# Patient Record
Sex: Female | Born: 1970 | Race: White | Hispanic: Yes | Marital: Single | Smoking: Current some day smoker
Health system: Southern US, Community
[De-identification: ages and names within clinical notes are randomized; demographics above are authoritative.]

## PROBLEM LIST (undated history)

## (undated) DIAGNOSIS — F329 Major depressive disorder, single episode, unspecified: Secondary | ICD-10-CM

## (undated) DIAGNOSIS — M1990 Unspecified osteoarthritis, unspecified site: Secondary | ICD-10-CM

## (undated) DIAGNOSIS — F32A Depression, unspecified: Secondary | ICD-10-CM

## (undated) DIAGNOSIS — K5792 Diverticulitis of intestine, part unspecified, without perforation or abscess without bleeding: Secondary | ICD-10-CM

## (undated) HISTORY — DX: Diverticulitis of intestine, part unspecified, without perforation or abscess without bleeding: K57.92

## (undated) HISTORY — DX: Unspecified osteoarthritis, unspecified site: M19.90

---

## 2010-01-07 DIAGNOSIS — K5792 Diverticulitis of intestine, part unspecified, without perforation or abscess without bleeding: Secondary | ICD-10-CM

## 2010-01-07 HISTORY — DX: Diverticulitis of intestine, part unspecified, without perforation or abscess without bleeding: K57.92

## 2010-01-07 LAB — HM COLONOSCOPY

## 2012-08-19 ENCOUNTER — Ambulatory Visit (INDEPENDENT_AMBULATORY_CARE_PROVIDER_SITE_OTHER): Payer: PRIVATE HEALTH INSURANCE | Admitting: Emergency Medicine

## 2012-08-19 ENCOUNTER — Ambulatory Visit: Payer: PRIVATE HEALTH INSURANCE

## 2012-08-19 VITALS — BP 122/68 | HR 64 | Temp 98.3°F | Resp 16 | Ht 64.0 in | Wt 244.0 lb

## 2012-08-19 DIAGNOSIS — M722 Plantar fascial fibromatosis: Secondary | ICD-10-CM

## 2012-08-19 DIAGNOSIS — IMO0002 Reserved for concepts with insufficient information to code with codable children: Secondary | ICD-10-CM

## 2012-08-19 DIAGNOSIS — S86912A Strain of unspecified muscle(s) and tendon(s) at lower leg level, left leg, initial encounter: Secondary | ICD-10-CM

## 2012-08-19 MED ORDER — NAPROXEN SODIUM 550 MG PO TABS: 550.0000 mg | ORAL_TABLET | Freq: Two times a day (BID) | ORAL | Status: DC

## 2012-08-19 NOTE — Progress Notes (Signed)
[facility] [address] [place] [address] [phone]  Date:  08/19/2012   Name:  Jean Bolton   DOB:  1970-02-06   MRN:  [id number]  PCP:  No PCP Per Patient    Chief Complaint: Knee Pain and Foot Pain   History of Present Illness:  Jean Bolton is a 42 y.o. very pleasant female patient who presents with the following:  Was doing squats in her morning workout and noticed that she had pain in her left knee after a short run.  Has pain with bending but no clicking or locking.  No history of documented injury. No history of prior knee injury as well.   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  Also has long history of am pain in right foot anterior to heel that persists.  No history of injury or overuse.  Thinks she has a spur.  No improvement with over the counter medications or other home remedies. Denies other complaint or health concern today.   There are no active problems to display for this patient.   History reviewed. No pertinent past medical history.  History reviewed. No pertinent past surgical history.  History  Substance Use Topics  . Smoking status: Never Smoker   . Smokeless tobacco: Not on file  . Alcohol Use: No    Family History  Problem Relation Age of Onset  . Diabetes Mother   . Hypertension Mother   . Hypertension Father     No Known Allergies  Medication list has been reviewed and updated.  No current outpatient prescriptions on file prior to visit.   No current facility-administered medications on file prior to visit.    Review of Systems:  As per HPI, otherwise negative.    Physical Examination: Filed Vitals:   08/19/12 2012  BP: 122/68  Pulse: 64  Temp: 98.3 F (36.8 C)  Resp: 16   Filed Vitals:   08/19/12 2012  Height: 5\' 4"  (1.626 m)  Weight: 244 lb (110.678 kg)   Body mass index is 41.86 kg/(m^2). Ideal Body Weight: Weight in (lb) to have BMI = 25:  145.3   GEN: WDWN, NAD, Non-toxic, Alert & Oriented x 3 HEENT: Atraumatic, Normocephalic.  Ears and Nose: No external deformity. EXTR: No clubbing/cyanosis/edema NEURO: Normal gait.  PSYCH: Normally interactive. Conversant. Not depressed or anxious appearing.  Calm demeanor.  LEFT KNEE:  Tender.  Scant effusion.  Full ROM passive. RIGHT foot:  Tender anterior to heel no ecchymosis swelling or deformity.  Assessment and Plan: Plantar fasciitis Knee strain Anaprox   Signed,  Phillips Odor, MD   [facility] reading (PRIMARY) by  Dr. Dareen Piano  Knee negative.  [facility] reading (PRIMARY) by  Dr. Dareen Piano.  Foot spur.

## 2012-08-19 NOTE — Patient Instructions (Addendum)

## 2012-10-29 ENCOUNTER — Emergency Department (HOSPITAL_BASED_OUTPATIENT_CLINIC_OR_DEPARTMENT_OTHER)
Admission: EM | Admit: 2012-10-29 | Discharge: 2012-10-29 | Disposition: A | Discharge status: Home / Self Care | Payer: PRIVATE HEALTH INSURANCE | Attending: Emergency Medicine | Admitting: Emergency Medicine

## 2012-10-29 ENCOUNTER — Encounter (HOSPITAL_BASED_OUTPATIENT_CLINIC_OR_DEPARTMENT_OTHER): Payer: Self-pay | Admitting: Emergency Medicine

## 2012-10-29 ENCOUNTER — Emergency Department (HOSPITAL_BASED_OUTPATIENT_CLINIC_OR_DEPARTMENT_OTHER): Payer: PRIVATE HEALTH INSURANCE

## 2012-10-29 DIAGNOSIS — S8392XA Sprain of unspecified site of left knee, initial encounter: Secondary | ICD-10-CM

## 2012-10-29 DIAGNOSIS — F17200 Nicotine dependence, unspecified, uncomplicated: Secondary | ICD-10-CM | POA: Insufficient documentation

## 2012-10-29 DIAGNOSIS — X500XXA Overexertion from strenuous movement or load, initial encounter: Secondary | ICD-10-CM | POA: Insufficient documentation

## 2012-10-29 DIAGNOSIS — F329 Major depressive disorder, single episode, unspecified: Secondary | ICD-10-CM | POA: Insufficient documentation

## 2012-10-29 DIAGNOSIS — Z79899 Other long term (current) drug therapy: Secondary | ICD-10-CM | POA: Insufficient documentation

## 2012-10-29 DIAGNOSIS — IMO0002 Reserved for concepts with insufficient information to code with codable children: Secondary | ICD-10-CM | POA: Insufficient documentation

## 2012-10-29 DIAGNOSIS — Y929 Unspecified place or not applicable: Secondary | ICD-10-CM | POA: Insufficient documentation

## 2012-10-29 DIAGNOSIS — Y9302 Activity, running: Secondary | ICD-10-CM | POA: Insufficient documentation

## 2012-10-29 DIAGNOSIS — F3289 Other specified depressive episodes: Secondary | ICD-10-CM | POA: Insufficient documentation

## 2012-10-29 HISTORY — DX: Depression, unspecified: F32.A

## 2012-10-29 HISTORY — DX: Major depressive disorder, single episode, unspecified: F32.9

## 2012-10-29 MED ORDER — OXYCODONE-ACETAMINOPHEN 5-325 MG PO TABS: 1.0000 | ORAL_TABLET | ORAL | Status: DC | PRN

## 2012-10-29 MED ORDER — CYCLOBENZAPRINE HCL 10 MG PO TABS: 10.0000 mg | ORAL_TABLET | Freq: Two times a day (BID) | ORAL | Status: DC | PRN

## 2012-10-29 NOTE — ED Notes (Signed)
Pt hyperflexed left knee approx 3 weeks ago.  Continues to have ROM issues and pain.

## 2012-10-29 NOTE — ED Provider Notes (Signed)
CSN: [id number]     Arrival date & time 10/29/12  1015 History   First MD Initiated Contact with Patient 10/29/12 1052     Chief Complaint  Patient presents with  . Knee Injury   (Consider location/radiation/quality/duration/timing/severity/associated sxs/prior Treatment) HPI Comments: 42 year old female presents with 3 weeks of left knee pain. She states that when she is running she felt and heard a pop. She's does not remember a specific injury but thinks she may have hyperextended it. She's not noticed any swelling. She denies any redness. She's been able to walk on it but when she stands for prolonged times at work it hurts worse. She injured his knee a couple months ago and saw her PCP who did an x-ray and gave her some naproxen. The pain is not quite similar to that. She denies any weakness or numbness. She was told she had an MRI at this facility and so she came to get checked out. She states she's not feeling her insurance did not for her to get an orthopedist. Besides compresses she's not tried anything for the pain   Past Medical History  Diagnosis Date  . Depression    No past surgical history on file. Family History  Problem Relation Age of Onset  . Diabetes Mother   . Hypertension Mother   . Hypertension Father    History  Substance Use Topics  . Smoking status: Current Some Day Smoker  . Smokeless tobacco: Not on file  . Alcohol Use: No   OB History   Grav Para Term Preterm Abortions TAB SAB Ect Mult Living                 Review of Systems  Constitutional: Negative for fever and chills.  Musculoskeletal: Negative for gait problem and joint swelling.       Left knee pain  Skin: Negative for color change and wound.  All other systems reviewed and are negative.    Allergies  Review of patient's allergies indicates no known allergies.  Home Medications   Current Outpatient Rx  Name  Route  Sig  Dispense  Refill  . buPROPion (WELLBUTRIN XL) 300 MG 24 hr  tablet   Oral   Take 300 mg by mouth daily.         Marland Kitchen oxyCODONE-acetaminophen (PERCOCET) 5-325 MG per tablet   Oral   Take 1 tablet by mouth every 4 (four) hours as needed for pain.   10 tablet   0    BP 147/74  Pulse 67  Temp(Src) 98.2 F (36.8 C)  Resp 20  Ht 5\' 4"  (1.626 m)  Wt 239 lb (108.41 kg)  BMI 41 kg/m2  SpO2 100%  LMP 10/12/2012 Physical Exam  Nursing note and vitals reviewed. Constitutional: She is oriented to person, place, and time. She appears well-developed and well-nourished.  HENT:  Head: Normocephalic and atraumatic.  Right Ear: External ear normal.  Left Ear: External ear normal.  Nose: Nose normal.  Eyes: Right eye exhibits no discharge. Left eye exhibits no discharge.  Cardiovascular: Intact distal pulses.   Pulses:      Dorsalis pedis pulses are 2+ on the right side, and 2+ on the left side.  Pulmonary/Chest: Effort normal.  Abdominal: Soft. She exhibits no distension.  Musculoskeletal:       Left knee: She exhibits no swelling, no deformity and no erythema. Tenderness (Inferior to the knee medially) found.  No ligamentous laxity upon testing. Normal neurovascular status of left leg  Neurological: She is alert and oriented to person, place, and time.  Skin: Skin is warm and dry. No erythema.    ED Course  Procedures (including critical care time) Labs Review Labs Reviewed - No data to display Imaging Review Dg Knee Complete 4 Views Left  10/29/2012   CLINICAL DATA:  Pain post trauma  EXAM: LEFT KNEE - COMPLETE 4+ VIEW  COMPARISON:  August 19, 2012  FINDINGS: Frontal, lateral, and bilateral oblique views were obtained. No fracture, dislocation, or effusion. Joint spaces appear intact. No erosive change.  Small calcifications anterior to the proximal tibia probably represent small phleboliths.  IMPRESSION: No fracture or appreciable arthropathy. No demonstrable effusion.   Electronically Signed   By: Bretta Bang M.D.   On: 10/29/2012 10:46     EKG Interpretation   None       MDM   1. Left knee sprain, initial encounter    Xray is benign. Is able to ambulate w/o difficulty in the ED. No MRI available at this facility, and would not be emergently indicated anyway. No joint swelling to suggest an effusion. Will treat symptomatically and recommend RICE care and f/u with orthopedist.    Audree Camel, MD 10/29/12 [phone]

## 2012-10-30 ENCOUNTER — Encounter: Payer: Self-pay | Admitting: Family Medicine

## 2012-10-30 ENCOUNTER — Ambulatory Visit (INDEPENDENT_AMBULATORY_CARE_PROVIDER_SITE_OTHER): Payer: PRIVATE HEALTH INSURANCE | Admitting: Family Medicine

## 2012-10-30 VITALS — BP 138/79 | HR 74 | Ht 64.0 in | Wt 239.0 lb

## 2012-10-30 DIAGNOSIS — M25562 Pain in left knee: Secondary | ICD-10-CM

## 2012-10-30 DIAGNOSIS — M25569 Pain in unspecified knee: Secondary | ICD-10-CM

## 2012-10-30 NOTE — Patient Instructions (Signed)
Your history and exam are consistent with a distal hamstring stain/partial tear, less likely a medial meniscus tear. Both are treated conservatively initially. Aleve 2 tabs twice a day with food for pain and inflammation. Icing 15 minutes at a time 3-4 times a day. Start physical therapy and do home exercises on days you don't go to therapy. ACE wrap or compression sleeve when up and walking around. Follow up with me in 1 month. If not improving would consider an MRI.

## 2012-11-03 ENCOUNTER — Encounter: Payer: Self-pay | Admitting: Family Medicine

## 2012-11-03 DIAGNOSIS — M25562 Pain in left knee: Secondary | ICD-10-CM | POA: Insufficient documentation

## 2012-11-03 NOTE — Progress Notes (Signed)
Patient ID: Jean Bolton, female   DOB: 02-Jul-1970, 42 y.o.   MRN: [id number]  PCP: No PCP Per Patient  Subjective:   HPI: Patient is a 42 y.o. female here for left knee injury.  Patient reports on 10/3 she was running on the street when she felt a pop posterior left knee with associated swelling. No bruising. No prior knee issues. Had difficulty walking, standing after this. Knee feels unstable. Still having trouble walking comfortably. No locking, catching.  Past Medical History  Diagnosis Date  . Depression     Current Outpatient Prescriptions on File Prior to Visit  Medication Sig Dispense Refill  . buPROPion (WELLBUTRIN XL) 300 MG 24 hr tablet Take 300 mg by mouth daily.      Marland Kitchen oxyCODONE-acetaminophen (PERCOCET) 5-325 MG per tablet Take 1 tablet by mouth every 4 (four) hours as needed for pain.  10 tablet  0   No current facility-administered medications on file prior to visit.    History reviewed. No pertinent past surgical history.  No Known Allergies  History   Social History  . Marital Status: Single    Spouse Name: N/A    Number of Children: N/A  . Years of Education: N/A   Occupational History  . Not on file.   Social History Main Topics  . Smoking status: Current Some Day Smoker  . Smokeless tobacco: Not on file  . Alcohol Use: No  . Drug Use: No  . Sexual Activity: Not on file   Other Topics Concern  . Not on file   Social History Narrative  . No narrative on file    Family History  Problem Relation Age of Onset  . Diabetes Mother   . Hypertension Mother   . Hyperlipidemia Mother   . Hypertension Father   . Hyperlipidemia Father   . Heart attack Neg Hx   . Sudden death Neg Hx     BP 138/79  Pulse 74  Ht 5\' 4"  (1.626 m)  Wt 239 lb (108.41 kg)  BMI 41 kg/m2  LMP 10/12/2012  Review of Systems: See HPI above.    Objective:  Physical Exam:  Gen: NAD  Left knee: No gross deformity, ecchymoses, effusion. TTP medial  hamstring reproducing pain though tendon feels intact.  Tender through to pes area. FROM.  Pain with resisted knee flexion at 90 degrees. Negative ant/post drawers. Negative valgus/varus testing. Negative lachmanns. Negative mcmurrays, apleys, patellar apprehension. NV intact distally.    Assessment & Plan:  1. Left knee injury - Her history of posterior knee pain/pop with pain wrapping around to medial knee, lack of effusion on exam, otherwise benign exam all consistent with distal hamstring strain or partial tear.  Start with icing, nsaids, compression, physical therapy and home exercises.  F/u in 1 month.  Consider MRI if not improving.

## 2012-11-03 NOTE — Assessment & Plan Note (Signed)
Her history of posterior knee pain/pop with pain wrapping around to medial knee, lack of effusion on exam, otherwise benign exam all consistent with distal hamstring strain or partial tear.  Start with icing, nsaids, compression, physical therapy and home exercises.  F/u in 1 month.  Consider MRI if not improving.

## 2012-11-30 ENCOUNTER — Ambulatory Visit (INDEPENDENT_AMBULATORY_CARE_PROVIDER_SITE_OTHER): Payer: PRIVATE HEALTH INSURANCE | Admitting: Family Medicine

## 2012-11-30 ENCOUNTER — Encounter: Payer: Self-pay | Admitting: Family Medicine

## 2012-11-30 VITALS — BP 151/86 | HR 91 | Ht 64.0 in | Wt 240.0 lb

## 2012-11-30 DIAGNOSIS — M25562 Pain in left knee: Secondary | ICD-10-CM

## 2012-11-30 DIAGNOSIS — M19049 Primary osteoarthritis, unspecified hand: Secondary | ICD-10-CM

## 2012-11-30 DIAGNOSIS — M25569 Pain in unspecified knee: Secondary | ICD-10-CM

## 2012-12-01 ENCOUNTER — Encounter (INDEPENDENT_AMBULATORY_CARE_PROVIDER_SITE_OTHER): Payer: Self-pay

## 2012-12-01 ENCOUNTER — Encounter: Payer: Self-pay | Admitting: Family Medicine

## 2012-12-01 ENCOUNTER — Ambulatory Visit (INDEPENDENT_AMBULATORY_CARE_PROVIDER_SITE_OTHER): Payer: PRIVATE HEALTH INSURANCE | Admitting: Family Medicine

## 2012-12-01 VITALS — BP 130/79 | HR 84 | Ht 64.0 in | Wt 240.0 lb

## 2012-12-01 DIAGNOSIS — M19049 Primary osteoarthritis, unspecified hand: Secondary | ICD-10-CM

## 2012-12-01 NOTE — Progress Notes (Signed)
Patient ID: Jean Bolton, female   DOB: May 15, 1970, 42 y.o.   MRN: [id number]  PCP: No PCP Per Patient  Subjective:   HPI: Patient is a 42 y.o. female here for left knee injury.  10/24: Patient reports on 10/3 she was running on the street when she felt a pop posterior left knee with associated swelling. No bruising. No prior knee issues. Had difficulty walking, standing after this. Knee feels unstable. Still having trouble walking comfortably. No locking, catching.  11/24: Patient states her knee feels much better. Only occasional pain in certain positions. Able to exercise though. Did not do formal PT but compliant with HEP. Not taking anything for pain now. No catching, locking, giving out.  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 Would like injection.  Past Medical History  Diagnosis Date  . Depression     Current Outpatient Prescriptions on File Prior to Visit  Medication Sig Dispense Refill  . buPROPion (WELLBUTRIN XL) 300 MG 24 hr tablet Take 300 mg by mouth daily.      Marland Kitchen oxyCODONE-acetaminophen (PERCOCET) 5-325 MG per tablet Take 1 tablet by mouth every 4 (four) hours as needed for pain.  10 tablet  0   No current facility-administered medications on file prior to visit.    History reviewed. No pertinent past surgical history.  No Known Allergies  History   Social History  . Marital Status: Single    Spouse Name: N/A    Number of Children: N/A  . Years of Education: N/A   Occupational History  . Not on file.   Social History Main Topics  . Smoking status: Current Some Day Smoker  . Smokeless tobacco: Not on file  . Alcohol Use: No  . Drug Use: No  . Sexual Activity: Not on file   Other Topics Concern  . Not on file   Social History Narrative  . No narrative on file    Family History  Problem Relation Age of Onset  . Diabetes Mother   . Hypertension Mother   . Hyperlipidemia  Mother   . Hypertension Father   . Hyperlipidemia Father   . Heart attack Neg Hx   . Sudden death Neg Hx     BP 151/86  Pulse 91  Ht 5\' 4"  (1.626 m)  Wt 240 lb (108.863 kg)  BMI 41.18 kg/m2  Review of Systems: See HPI above.    Objective:  Physical Exam:  Gen: NAD  Left knee: No gross deformity, ecchymoses, effusion. No longer with TTP medial hamstring.  No joint line, other TTP. FROM.  No pain with resisted knee flexion at 90 degrees. Negative ant/post drawers. Negative valgus/varus testing. Negative lachmanns. Negative mcmurrays, apleys, patellar apprehension. NV intact distally.    Assessment & Plan:  1. Left knee injury - Consistent with distal hamstring strain but has improved significantly since last visit.  Encouraged to continue with HEP for next 4-6 weeks.  Icing, nsaids, compression as needed.    2. 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  No evidence carpal tunnel, dequervains, other pathology.  Will return tomorrow to go ahead with injection.  Discussed thumb spica brace also.

## 2012-12-01 NOTE — Assessment & Plan Note (Signed)
Consistent with distal hamstring strain but has improved significantly since last visit.  Encouraged to continue with HEP for next 4-6 weeks.  Icing, nsaids, compression as needed.

## 2012-12-01 NOTE — Assessment & Plan Note (Signed)
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  No evidence carpal tunnel, dequervains, other pathology.  Will return tomorrow to go ahead with injection.  Discussed thumb spica brace also.

## 2012-12-05 ENCOUNTER — Encounter: Payer: Self-pay | Admitting: Family Medicine

## 2012-12-05 NOTE — Assessment & Plan Note (Signed)
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  No evidence carpal tunnel, dequervains, other pathology.  Injection given today with ultrasound to identify joint space.  After informed written consent patient was seated in exam room.  1st CMC joint left hand identified with ultrasound.  Prepped with alcohol swab then injected with 0.5:0.23mL marcaine:depomedrol.  Patient tolerated procedure well without immediate complications.

## 2012-12-05 NOTE — Progress Notes (Signed)
Patient ID: Jean Bolton, female   DOB: October 06, 1970, 42 y.o.   MRN: [id number]  PCP: No PCP Per Patient  Subjective:   HPI: Patient is a 42 y.o. female here for left knee injury.  10/24: Patient reports on 10/3 she was running on the street when she felt a pop posterior left knee with associated swelling. No bruising. No prior knee issues. Had difficulty walking, standing after this. Knee feels unstable. Still having trouble walking comfortably. No locking, catching.  11/24: Patient states her knee feels much better. Only occasional pain in certain positions. Able to exercise though. Did not do formal PT but compliant with HEP. Not taking anything for pain now. No catching, locking, giving out.  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 Would like injection.  11/25: Patient returns for 1st [facility] injection left hand.  Past Medical History  Diagnosis Date  . Depression     Current Outpatient Prescriptions on File Prior to Visit  Medication Sig Dispense Refill  . buPROPion (WELLBUTRIN XL) 300 MG 24 hr tablet Take 300 mg by mouth daily.      Marland Kitchen oxyCODONE-acetaminophen (PERCOCET) 5-325 MG per tablet Take 1 tablet by mouth every 4 (four) hours as needed for pain.  10 tablet  0   No current facility-administered medications on file prior to visit.    History reviewed. No pertinent past surgical history.  No Known Allergies  History   Social History  . Marital Status: Single    Spouse Name: N/A    Number of Children: N/A  . Years of Education: N/A   Occupational History  . Not on file.   Social History Main Topics  . Smoking status: Current Some Day Smoker  . Smokeless tobacco: Not on file  . Alcohol Use: No  . Drug Use: No  . Sexual Activity: Not on file   Other Topics Concern  . Not on file   Social History Narrative  . No narrative on file    Family History  Problem Relation Age of Onset  .  Diabetes Mother   . Hypertension Mother   . Hyperlipidemia Mother   . Hypertension Father   . Hyperlipidemia Father   . Heart attack Neg Hx   . Sudden death Neg Hx     BP 130/79  Pulse 84  Ht 5\' 4"  (1.626 m)  Wt 240 lb (108.863 kg)  BMI 41.18 kg/m2  Review of Systems: See HPI above.    Objective:  Physical Exam:  Gen: NAD  Left knee: No gross deformity, ecchymoses, effusion. No longer with TTP medial hamstring.  No joint line, other TTP. FROM.  No pain with resisted knee flexion at 90 degrees. Negative ant/post drawers. Negative valgus/varus testing. Negative lachmanns. Negative mcmurrays, apleys, patellar apprehension. NV intact distally.  Left hand: No gross deformity, swelling bruising. TTP 1st CMC.  No 1st dorsal compartment, carpal tunnel tenderness. Negative finkelsteins, tinels.    Assessment & Plan:  1. 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  No evidence carpal tunnel, dequervains, other pathology.  Injection given today with ultrasound to identify joint space.  After informed written consent patient was seated in exam room.  1st CMC joint left hand identified with ultrasound.  Prepped with alcohol swab then injected with 0.5:0.69mL marcaine:depomedrol.  Patient tolerated procedure well without immediate complications.

## 2012-12-09 ENCOUNTER — Encounter: Payer: Self-pay | Admitting: Family

## 2012-12-09 ENCOUNTER — Ambulatory Visit (INDEPENDENT_AMBULATORY_CARE_PROVIDER_SITE_OTHER): Payer: PRIVATE HEALTH INSURANCE | Admitting: Family

## 2012-12-09 VITALS — BP 122/88 | HR 78 | Temp 98.1°F | Resp 16 | Ht 64.5 in | Wt 240.0 lb

## 2012-12-09 DIAGNOSIS — F329 Major depressive disorder, single episode, unspecified: Secondary | ICD-10-CM

## 2012-12-09 DIAGNOSIS — R002 Palpitations: Secondary | ICD-10-CM | POA: Insufficient documentation

## 2012-12-09 DIAGNOSIS — K589 Irritable bowel syndrome without diarrhea: Secondary | ICD-10-CM | POA: Insufficient documentation

## 2012-12-09 DIAGNOSIS — K8020 Calculus of gallbladder without cholecystitis without obstruction: Secondary | ICD-10-CM

## 2012-12-09 DIAGNOSIS — Z720 Tobacco use: Secondary | ICD-10-CM

## 2012-12-09 DIAGNOSIS — F17200 Nicotine dependence, unspecified, uncomplicated: Secondary | ICD-10-CM

## 2012-12-09 LAB — CBC WITH DIFFERENTIAL/PLATELET
Basophils Absolute: 0 10*3/uL (ref 0.0–0.1)
Basophils Relative: 0 % (ref 0–1)
Eosinophils Absolute: 0.1 10*3/uL (ref 0.0–0.7)
Eosinophils Relative: 1 % (ref 0–5)
HCT: 38.8 % (ref 36.0–46.0)
MCH: 30 pg (ref 26.0–34.0)
MCHC: 34 g/dL (ref 30.0–36.0)
MCV: 88.2 fL (ref 78.0–100.0)
Monocytes Absolute: 0.4 10*3/uL (ref 0.1–1.0)
Monocytes Relative: 6 % (ref 3–12)
Neutro Abs: 4.7 10*3/uL (ref 1.7–7.7)
Platelets: 330 10*3/uL (ref 150–400)
RDW: 13.8 % (ref 11.5–15.5)
WBC: 7 10*3/uL (ref 4.0–10.5)

## 2012-12-09 LAB — BASIC METABOLIC PANEL WITH GFR
BUN: 13 mg/dL (ref 6–23)
Calcium: 9.2 mg/dL (ref 8.4–10.5)
Creat: 0.65 mg/dL (ref 0.50–1.10)
GFR, Est African American: >89 mL/min
GFR, Est Non African American: >89 mL/min
Glucose, Bld: 71 mg/dL (ref 70–99)
Sodium: 138 mEq/L (ref 135–145)

## 2012-12-09 NOTE — Assessment & Plan Note (Signed)
We discussed complete cessation.

## 2012-12-09 NOTE — Assessment & Plan Note (Addendum)
New, mild. Check TSH, BMET, CBC.  EKG shows NSR.

## 2012-12-09 NOTE — Assessment & Plan Note (Signed)
Currently asymptomatic. I have advised pt to call if symptoms recur.

## 2012-12-09 NOTE — Progress Notes (Signed)
Subjective:    Patient ID: Jean Bolton, female    DOB: 03/10/1970, 42 y.o.   MRN: [id number]  HPI  Jean Bolton is a 42 yr old female who presents today to establish care.  Reports that she has suffered off/on with depression.  Reports she was first diagnosed in 2005.  She reports that she sleeps poorly.  + snoring, wakes up at 5 and works out.  Reports that she wakes up almost every hour.  Reports + concerns re: anxiety.  She denies panic attacks but reports wheels keep turning. Denies hopelessness.  She takes abilify and wellbutrin. She is followed at [facility].  Tobacco abuse- smokes 1 cigarette a week, down to 1-2 times a day on vapor.    CMC arthritis- had shot in left last week with Dr. Pearletha Forge last week. Office note is reviewed. Improved.  L knee pain- bothers her with running.  Had injury in October.  Reports + hx of gallstones- was recommended that she have cholecystectomy in 2012.  Did not due to insurance.  Last symptoms 2/14.    Palpitations- notes that she has intermittent brief palpitations once daily. Drinks 2 cups coffee a day.   Review of Systems  Constitutional:       Has lost 40 pounds in the last 6 months with exercising  HENT: Negative for rhinorrhea.   Eyes: Negative for visual disturbance.  Respiratory: Negative for cough.   Cardiovascular: Negative for chest pain.       Occasional palpitations- happens once a day.  Gastrointestinal:       Chronic constipation- IBS  Genitourinary: Negative for dysuria and menstrual problem.  Musculoskeletal: Positive for arthralgias. Negative for myalgias.  Skin: Negative for rash.  Neurological: Negative for headaches.  Hematological: Negative for adenopathy.  Psychiatric/Behavioral:       See HPI   Past Medical History  Diagnosis Date  . Depression   . Arthritis   . Diverticulitis 2012    History   Social History  . Marital Status: Single    Spouse Name: N/A    Number of Children: N/A  . Years of  Education: N/A   Occupational History  . Not on file.   Social History Main Topics  . Smoking status: Current Some Day Smoker  . Smokeless tobacco: Never Used     Comment: Pt is currently vaping.  . Alcohol Use: 1.5 - 3 oz/week    3-6 drink(s) per week  . Drug Use: No  . Sexual Activity: Not on file   Other Topics Concern  . Not on file   Social History Narrative   Daughter in [facility]   Son in [place]   Single   Has 1 Academic librarian at [employer]   Completed associates degree          History reviewed. No pertinent past surgical history.  Family History  Problem Relation Age of Onset  . Diabetes Mother   . Hypertension Mother   . Arthritis Mother   . Hypertension Father   . Arthritis Father   . Alcohol abuse Father   . Heart attack Neg Hx   . Sudden death Neg Hx   . Hypertension Maternal Grandmother   . Hypertension Maternal Grandfather   . Arthritis Maternal Grandfather     No Known Allergies  Current Outpatient Prescriptions on File Prior to Visit  Medication Sig Dispense Refill  . buPROPion (WELLBUTRIN XL) 300 MG 24 hr tablet Take 300 mg by mouth  daily.       No current facility-administered medications on file prior to visit.    BP 122/88  Pulse 78  Temp(Src) 98.1 F (36.7 C) (Oral)  Resp 16  Ht 5' 4.5" (1.638 m)  Wt 240 lb 0.6 oz (108.881 kg)  BMI 40.58 kg/m2  SpO2 99%  LMP 12/03/2012       Objective:   Physical Exam  Constitutional: She is oriented to person, place, and time. She appears well-developed and well-nourished. No distress.  HENT:  Head: Normocephalic and atraumatic.  Cardiovascular: Normal rate and regular rhythm.   No murmur heard. Pulmonary/Chest: Effort normal and breath sounds normal. No respiratory distress. She has no wheezes. She has no rales. She exhibits no tenderness.  Abdominal: Soft. Bowel sounds are normal.  Musculoskeletal: She exhibits no edema.  Lymphadenopathy:    She has no cervical adenopathy.  Neurological:  She is alert and oriented to person, place, and time.  Psychiatric: She has a normal mood and affect. Her behavior is normal. Judgment and thought content normal.          Assessment & Plan:

## 2012-12-09 NOTE — Patient Instructions (Signed)
Please complete lab work prior to leaving. Schedule fasting physical after the holidays. Welcome to [facility]!

## 2012-12-09 NOTE — Assessment & Plan Note (Addendum)
Stable. Some job stress. We discussed trying to meet with therapist at [facility]. She is agreeable.

## 2012-12-10 ENCOUNTER — Encounter: Payer: Self-pay | Admitting: Family

## 2012-12-11 ENCOUNTER — Encounter: Payer: Self-pay | Admitting: *Deleted

## 2013-01-13 ENCOUNTER — Ambulatory Visit (INDEPENDENT_AMBULATORY_CARE_PROVIDER_SITE_OTHER): Payer: PRIVATE HEALTH INSURANCE | Admitting: Family

## 2013-01-13 ENCOUNTER — Other Ambulatory Visit (HOSPITAL_COMMUNITY)
Admission: RE | Admit: 2013-01-13 | Discharge: 2013-01-13 | Disposition: A | Discharge status: Home / Self Care | Payer: PRIVATE HEALTH INSURANCE | Source: Ambulatory Visit | Attending: Family | Admitting: Family

## 2013-01-13 ENCOUNTER — Encounter: Payer: Self-pay | Admitting: Family

## 2013-01-13 VITALS — BP 130/86 | HR 64 | Temp 98.1°F | Resp 16 | Ht 64.5 in | Wt 247.0 lb

## 2013-01-13 DIAGNOSIS — Z0000 Encounter for general adult medical examination without abnormal findings: Secondary | ICD-10-CM | POA: Insufficient documentation

## 2013-01-13 DIAGNOSIS — Z1151 Encounter for screening for human papillomavirus (HPV): Secondary | ICD-10-CM | POA: Insufficient documentation

## 2013-01-13 DIAGNOSIS — Z01419 Encounter for gynecological examination (general) (routine) without abnormal findings: Secondary | ICD-10-CM | POA: Insufficient documentation

## 2013-01-13 LAB — HEPATIC FUNCTION PANEL
ALBUMIN: 3.9 g/dL (ref 3.5–5.2)
ALK PHOS: 58 U/L (ref 39–117)
ALT: 23 U/L (ref 0–35)
AST: 13 U/L (ref 0–37)
Bilirubin, Direct: 0.1 mg/dL (ref 0.0–0.3)
Indirect Bilirubin: 0.3 mg/dL (ref 0.0–0.9)
Total Bilirubin: 0.4 mg/dL (ref 0.3–1.2)
Total Protein: 6.2 g/dL (ref 6.0–8.3)

## 2013-01-13 LAB — CBC WITH DIFFERENTIAL/PLATELET
Basophils Absolute: 0 10*3/uL (ref 0.0–0.1)
Basophils Relative: 0 % (ref 0–1)
EOS ABS: 0.1 10*3/uL (ref 0.0–0.7)
Eosinophils Relative: 1 % (ref 0–5)
HCT: 36.5 % (ref 36.0–46.0)
HEMOGLOBIN: 12.1 g/dL (ref 12.0–15.0)
LYMPHS ABS: 1.7 10*3/uL (ref 0.7–4.0)
Lymphocytes Relative: 21 % (ref 12–46)
MCH: 30.3 pg (ref 26.0–34.0)
MCHC: 33.2 g/dL (ref 30.0–36.0)
MCV: 91.5 fL (ref 78.0–100.0)
MONOS PCT: 6 % (ref 3–12)
Monocytes Absolute: 0.5 10*3/uL (ref 0.1–1.0)
NEUTROS PCT: 72 % (ref 43–77)
Neutro Abs: 5.8 10*3/uL (ref 1.7–7.7)
Platelets: 280 10*3/uL (ref 150–400)
RBC: 3.99 MIL/uL (ref 3.87–5.11)
RDW: 13.5 % (ref 11.5–15.5)
WBC: 8 10*3/uL (ref 4.0–10.5)

## 2013-01-13 LAB — BASIC METABOLIC PANEL WITH GFR
BUN: 11 mg/dL (ref 6–23)
CHLORIDE: 104 meq/L (ref 96–112)
CO2: 25 mEq/L (ref 19–32)
Calcium: 8.6 mg/dL (ref 8.4–10.5)
Creat: 0.61 mg/dL (ref 0.50–1.10)
GFR, Est Non African American: >89 mL/min
Glucose, Bld: 77 mg/dL (ref 70–99)
POTASSIUM: 4.4 meq/L (ref 3.5–5.3)
SODIUM: 136 meq/L (ref 135–145)

## 2013-01-13 LAB — LIPID PANEL
CHOL/HDL RATIO: 2.8 ratio
Cholesterol: 164 mg/dL (ref 0–200)
HDL: 58 mg/dL (ref >39)
LDL CALC: 97 mg/dL (ref 0–99)
Triglycerides: 47 mg/dL (ref <150)
VLDL: 9 mg/dL (ref 0–40)

## 2013-01-13 LAB — TSH: TSH: 2.174 u[IU]/mL (ref 0.350–4.500)

## 2013-01-13 NOTE — Progress Notes (Signed)
Subjective:    Patient ID: Jean Bolton, female    DOB: Aug 29, 1970, 43 y.o.   MRN: [id number]  HPI  [name] is a 43 yr old female who presents today for cpx.  Immunizations: declines flu shot, tetanus up to date Diet: reports healthy diet Exercise: works out 4 days a week. Colonoscopy: up to date Pap Smear: due Mammogram: due  Depression- follows with psychiatry, stopped abilify as she did not feel that it was helping.  Still feels irritable.  Will arrange follow up at [facility]  Review of Systems  Constitutional:       Wt Readings from Last 3 Encounters: 01/13/13 : 247 lb 0.6 oz (112.057 kg) 12/09/12 : 240 lb 0.6 oz (108.881 kg) 12/01/12 : 240 lb (108.863 kg)   HENT: Negative for hearing loss.        Mild nasal congestion from resolving cold  Eyes: Negative for visual disturbance.  Respiratory: Negative for cough and shortness of breath.   Cardiovascular: Negative for chest pain.  Gastrointestinal: Positive for constipation.  Genitourinary: Negative for dysuria and frequency.       Reports normal menses, LMP 12/23  Musculoskeletal: Negative for arthralgias and myalgias.  Skin:       Dry lesion on back  Neurological: Negative for headaches.  Hematological:       Bruises easily but attributes to working out and "hitting myself"   Past Medical History  Diagnosis Date  . Depression   . Arthritis   . Diverticulitis 2012    History   Social History  . Marital Status: Single    Spouse Name: N/A    Number of Children: N/A  . Years of Education: N/A   Occupational History  . Not on file.   Social History Main Topics  . Smoking status: Current Some Day Smoker  . Smokeless tobacco: Never Used     Comment: Pt is currently vaping.  . Alcohol Use: 1.5 - 3 oz/week    3-6 drink(s) per week  . Drug Use: No  . Sexual Activity: Not on file   Other Topics Concern  . Not on file   Social History Narrative   Daughter in [facility]   Son in [place]   Single   Has 1 Biochemist, clinical at [employer]   Completed associates degree          History reviewed. No pertinent past surgical history.  Family History  Problem Relation Age of Onset  . Diabetes Mother   . Hypertension Mother   . Arthritis Mother   . Hypertension Father   . Arthritis Father   . Alcohol abuse Father   . Heart attack Neg Hx   . Sudden death Neg Hx   . Hypertension Maternal Grandmother   . Hypertension Maternal Grandfather   . Arthritis Maternal Grandfather     No Known Allergies  No current outpatient prescriptions on file prior to visit.   No current facility-administered medications on file prior to visit.    BP 130/86  Pulse 64  Temp(Src) 98.1 F (36.7 C) (Oral)  Resp 16  Ht 5' 4.5" (1.638 m)  Wt 247 lb 0.6 oz (112.057 kg)  BMI 41.76 kg/m2  SpO2 99%  LMP 12/29/2012       Objective:   Physical Exam  Physical Exam  Constitutional: She is oriented to person, place, and time. She appears well-developed and well-nourished. No distress.  HENT:  Head: Normocephalic and atraumatic.  Right  Ear: Tympanic membrane and ear canal normal.  Left Ear: Tympanic membrane and ear canal normal.  Mouth/Throat: Oropharynx is clear and moist.  Eyes: Pupils are equal, round, and reactive to light. No scleral icterus.  Neck: Normal range of motion. No thyromegaly present.  Cardiovascular: Normal rate and regular rhythm.   No murmur heard. Pulmonary/Chest: Effort normal and breath sounds normal. No respiratory distress. He has no wheezes. She has no rales. She exhibits no tenderness.  Abdominal: Soft. Bowel sounds are normal. He exhibits no distension and no mass. There is no tenderness. There is no rebound and no guarding.  Musculoskeletal: She exhibits no edema.  Lymphadenopathy:    She has no cervical adenopathy.  Neurological: She is alert and oriented to person, place, and time.She exhibits normal muscle tone. Coordination normal.  Skin: Skin is warm and dry.  Psychiatric:  She has a normal mood and affect. Her behavior is normal. Judgment and thought content normal.  Breasts: Examined lying Right: Without masses, retractions, discharge or axillary adenopathy.  Left: Without masses, retractions, discharge or axillary adenopathy.  Inguinal/mons: Normal without inguinal adenopathy  External genitalia: Normal  BUS/Urethra/Skene's glands: Normal  Bladder: Normal  Vagina: Normal  Cervix: Normal  Uterus: normal in size, shape and contour. Midline and mobile  Adnexa/parametria:  Rt: Without masses or tenderness.  Lt: Without masses or tenderness.  Anus and perineum: Normal           Assessment & Plan:           Assessment & Plan:

## 2013-01-13 NOTE — Progress Notes (Signed)
Pre visit review using our clinic review tool, if applicable. No additional management support is needed unless otherwise documented below in the visit note. 

## 2013-01-13 NOTE — Assessment & Plan Note (Signed)
Continue healthy diet, exercise, weight loss.  Reports "not really smoking."  Encouraged pt to remain off of cigarettes.  Obtain fasting labs, refer for mammogram, pap performed today.

## 2013-01-13 NOTE — Addendum Note (Signed)
Addended by: Mervin KungFERGERSON, Nahomi Hegner A on: 01/13/2013 10:03 AM   Modules accepted: Orders

## 2013-01-13 NOTE — Patient Instructions (Signed)
Please complete lab work prior to leaving. Continue healthy diet, exercise and weight loss efforts. It is important that you quit smoking Follow up in 1 year, sooner if problems/concerns.

## 2013-01-14 ENCOUNTER — Telehealth: Payer: Self-pay | Admitting: Family

## 2013-01-14 LAB — URINALYSIS, ROUTINE W REFLEX MICROSCOPIC
Bilirubin Urine: NEGATIVE
Glucose, UA: NEGATIVE mg/dL
Hgb urine dipstick: NEGATIVE
Ketones, ur: NEGATIVE mg/dL
LEUKOCYTES UA: NEGATIVE
Nitrite: NEGATIVE
PROTEIN: NEGATIVE mg/dL
Specific Gravity, Urine: 1.011 (ref 1.005–1.030)
UROBILINOGEN UA: 0.2 mg/dL (ref 0.0–1.0)
pH: 6.5 (ref 5.0–8.0)

## 2013-01-14 NOTE — Telephone Encounter (Signed)
Relevant patient education assigned to patient using Emmi. ° °

## 2013-01-15 ENCOUNTER — Encounter: Payer: Self-pay | Admitting: Family

## 2013-01-26 ENCOUNTER — Other Ambulatory Visit: Payer: Self-pay | Admitting: Family

## 2013-01-26 DIAGNOSIS — Z1231 Encounter for screening mammogram for malignant neoplasm of breast: Secondary | ICD-10-CM

## 2013-01-29 ENCOUNTER — Encounter: Payer: Self-pay | Admitting: Family

## 2013-02-02 ENCOUNTER — Telehealth: Payer: Self-pay | Admitting: Family

## 2013-02-02 NOTE — Telephone Encounter (Signed)
Says she left a my chart message about a diet med that she soul like to try and has had no reply please send to [facility] [employer] or call and advise

## 2013-02-03 ENCOUNTER — Telehealth: Payer: Self-pay | Admitting: Family

## 2013-02-03 MED ORDER — LORCASERIN HCL 10 MG PO TABS: 1.0000 | ORAL_TABLET | Freq: Two times a day (BID) | ORAL | Status: DC

## 2013-02-03 NOTE — Telephone Encounter (Signed)
Pt.notified

## 2013-02-03 NOTE — Telephone Encounter (Signed)
Rx left at front desk along with coupon card.  She will need to pick up rx monthly as prescription is controlled substance.

## 2013-02-03 NOTE — Telephone Encounter (Signed)
Rx completed, see 02/03/13 phone note.

## 2013-06-07 ENCOUNTER — Ambulatory Visit (INDEPENDENT_AMBULATORY_CARE_PROVIDER_SITE_OTHER): Payer: PRIVATE HEALTH INSURANCE | Admitting: Family Medicine

## 2013-06-07 ENCOUNTER — Encounter: Payer: Self-pay | Admitting: Family Medicine

## 2013-06-07 ENCOUNTER — Ambulatory Visit (INDEPENDENT_AMBULATORY_CARE_PROVIDER_SITE_OTHER): Payer: PRIVATE HEALTH INSURANCE

## 2013-06-07 VITALS — BP 126/84 | HR 66 | Ht 64.0 in | Wt 243.0 lb

## 2013-06-07 DIAGNOSIS — M79645 Pain in left finger(s): Secondary | ICD-10-CM

## 2013-06-07 DIAGNOSIS — M19049 Primary osteoarthritis, unspecified hand: Secondary | ICD-10-CM

## 2013-06-07 DIAGNOSIS — S53449A Ulnar collateral ligament sprain of unspecified elbow, initial encounter: Secondary | ICD-10-CM

## 2013-06-07 DIAGNOSIS — M79609 Pain in unspecified limb: Secondary | ICD-10-CM

## 2013-06-07 NOTE — Progress Notes (Signed)
[facility] [address] [place], [place] [address] Phone: [phone] Subjective:       CC: Left thumb pain  JFT:[id number] Jean Bolton is a 43 y.o. female coming in with complaint of left thumb pain. Patient has had this pain for quite some time patient has been seen previously greater than a month ago for a arthritis of the left thumb. Patient did have injection in November which did have good relief for approximately 5 months. Patient states over the course of time she started having increasing pain again. 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  Denies any radiation down the thumb or any numbness. Patient states from time to time it feels like low and wants to just popped out of the joint. Severity is 9/10.    Past medical history, social, surgical and family history all reviewed in electronic medical record.   Review of Systems: No headache, visual changes, nausea, vomiting, diarrhea, constipation, dizziness, abdominal pain, skin rash, fevers, chills, night sweats, weight loss, swollen lymph nodes, body aches, joint swelling, muscle aches, chest pain, shortness of breath, mood changes.   Objective Blood pressure 126/84, pulse 66, height 5\' 4"  (1.626 m), weight 243 lb (110.224 kg), SpO2 98.00%.  General: No apparent distress alert and oriented x3 mood and affect normal, dressed appropriately.  HEENT: Pupils equal, extraocular movements intact  Respiratory: Patient's speak in full sentences and does not appear short of breath  Cardiovascular: No lower extremity edema, non tender, no erythema  Skin: Warm dry intact with no signs of infection or rash on extremities or on axial skeleton.  Abdomen: Soft nontender  Neuro: Cranial nerves II through XII are intact, neurovascularly intact in all extremities with 2+ DTRs and 2+ pulses.  Lymph: No lymphadenopathy of posterior or anterior cervical chain or axillae bilaterally.  Gait normal with  good balance and coordination.  MSK:  Non tender with full range of motion and good stability and symmetric strength and tone of shoulders, elbows, hip, knee and ankles bilaterally.  Wrist: Left Inspection normal with no visible erythema or swelling. ROM smooth and normal with good flexion and extension and ulnar/radial deviation that is symmetrical with opposite wrist. Palpation is normal over metacarpals, navicular, lunate, and TFCC; tendons without tenderness/ swelling No snuffbox tenderness. No tenderness over Canal of Guyon. Strength 5/5 in all directions without pain. Negative Finkelstein, tinel's and phalens. Negative Watson's test. Positive pain with compression of the [facility] joint Patient also has laxity of the ulnar cruciate ligament  MSK US performed of: Left hand and wrist This study was ordered, performed, and interpreted by Terrilee Files D.O.  Wrist: All extensor compartments visualized and tendons all normal in appearance without fraying, tears, or sheath effusions. No effusion seen. TFCC intact. Scapholunate ligament intact. Carpal tunnel visualized and median nerve area normal, flexor tendons all normal in appearance without fraying, tears, or sheath effusions. Power doppler signal normal. Patient does have extensive osteoarthritic changes of the [facility] joint and patient does have significant laxity with gapping of the UCL that does appear to be degenerative  IMPRESSION:  UCL injury and CMC arthritis  After verbal consent patient was prepped with alcohol swabs and with a 25-gauge 1 inch needle was injected with 0.5 cc of 0.5% Marcaine and 0.5 cc of Kenalog 40 mg/dL. Patient tolerated the procedure well pictures were saved. Patient had near resolution of pain immediately.  Patient was also fitted in the EXOS thumb spica splint. This was  performed by me today.       Impression and Recommendations:     This case required medical decision making of moderate  complexity.

## 2013-06-07 NOTE — Patient Instructions (Signed)
Good to see you Ice 20 minutes after activity Heat before activity Try topical 2 times daily Wear brace at work.  In 3 weeks start exercises Would love to see you in 3 weeks

## 2013-06-07 NOTE — Assessment & Plan Note (Signed)
Injected today under ultrasound. Patient tolerated the procedure very well. I do think that this can help patient's pain significantly. Patient though we need to slow down the progression with her only being 43 years of age. Patient does have laxity of the ulnar collateral ligament that I think is contributing to her pain. Patient was put in a brace today which ethical be helpful. Patient will follow up with me again in 3 weeks for further evaluation.  Trial size of topical anti-inflammatory was given the patient today as well. We discussed icing and heating as well that could be helpful.

## 2013-08-02 ENCOUNTER — Ambulatory Visit (INDEPENDENT_AMBULATORY_CARE_PROVIDER_SITE_OTHER): Payer: PRIVATE HEALTH INSURANCE

## 2013-08-02 ENCOUNTER — Ambulatory Visit (INDEPENDENT_AMBULATORY_CARE_PROVIDER_SITE_OTHER): Payer: PRIVATE HEALTH INSURANCE | Admitting: Family Medicine

## 2013-08-02 VITALS — BP 122/82 | HR 69 | Temp 98.1°F | Resp 18 | Ht 65.0 in | Wt 249.0 lb

## 2013-08-02 DIAGNOSIS — S8990XA Unspecified injury of unspecified lower leg, initial encounter: Secondary | ICD-10-CM

## 2013-08-02 DIAGNOSIS — S99911A Unspecified injury of right ankle, initial encounter: Secondary | ICD-10-CM

## 2013-08-02 DIAGNOSIS — S99929A Unspecified injury of unspecified foot, initial encounter: Secondary | ICD-10-CM

## 2013-08-02 DIAGNOSIS — S99919A Unspecified injury of unspecified ankle, initial encounter: Secondary | ICD-10-CM

## 2013-08-02 MED ORDER — TRAMADOL HCL 50 MG PO TABS: 50.0000 mg | ORAL_TABLET | Freq: Three times a day (TID) | ORAL | Status: DC | PRN

## 2013-08-02 NOTE — Patient Instructions (Signed)
Use the walking boot for support- do NOT drive while wearing this.  Try to ice and elevate your ankle when you can. Ibuprofen will help with swelling and pain- you may also use tramadol if you need something a little stronger, but this will make you drowsy.  If your ankle is not better in the next 1-2 weeks please call me or come back in.

## 2013-08-02 NOTE — Progress Notes (Signed)
[facility] [address], [place] [address] [phone]  Date:  08/02/2013   Name:  Jean Bolton   DOB:  1970-03-15   MRN:  [id number]  PCP:  Jean Bolton,Jean S., NP    Chief Complaint: Ankle Injury   History of Present Illness:  Jean Bolton is a 43 y.o. very pleasant female patient who presents with the following:  She fell into a hole while getting out of her car 2 days ago- twisted her right ankle.  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  She is otherwise unhurt She was able to limp on it, but is favoring it a good bit.   She has had some twisted ankles in the past, but never had any surgery or any fracture that she can recall.    LMP less than a month ago.  States no concern of current pregnancy   Patient Active Problem List   Diagnosis Date Noted  . Ulnar collateral ligament sprain(841.1) 06/07/2013  . Routine general medical examination at a health care facility 01/13/2013  . IBS (irritable bowel syndrome) 12/09/2012  . Depression 12/09/2012  . Tobacco abuse 12/09/2012  . Palpitations 12/09/2012  . Cholelithiasis 12/09/2012  . 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 Left knee pain 11/03/2012    Past Medical History  Diagnosis Date  . Depression   . Arthritis   . Diverticulitis 2012    History reviewed. No pertinent past surgical history.  History  Substance Use Topics  . Smoking status: Current Some Day Smoker  . Smokeless tobacco: Never Used     Comment: Pt is currently vaping.  . Alcohol Use: 1.5 - 3.0 oz/week    3-6 drink(s) per week    Family History  Problem Relation Age of Onset  . Diabetes Mother   . Hypertension Mother   . Arthritis Mother   . Hypertension Father   . Arthritis Father   . Alcohol abuse Father   . Heart attack Neg Hx   . Sudden death Neg Hx   . Hypertension Maternal Grandmother   . Hypertension Maternal Grandfather   . Arthritis Maternal Grandfather     No Known Allergies  Medication  list has been reviewed and updated.  No current outpatient prescriptions on file prior to visit.   No current facility-administered medications on file prior to visit.    Review of Systems:  As per HPI- otherwise negative.   Physical Examination: Filed Vitals:   08/02/13 1317  BP: 122/82  Pulse: 69  Temp: 98.1 F (36.7 C)  Resp: 18   Filed Vitals:   08/02/13 1317  Height: 5\' 5"  (1.651 m)  Weight: 249 lb (112.946 kg)   Body mass index is 41.44 kg/(m^2). Ideal Body Weight: Weight in (lb) to have BMI = 25: 149.9  GEN: WDWN, NAD, Non-toxic, A & O x 3, obese, looks well HEENT: Atraumatic, Normocephalic. Neck supple. No masses, No LAD. Ears and Nose: No external deformity. CV: RRR, No M/G/R. No JVD. No thrill. No extra heart sounds. PULM: CTA B, no wheezes, crackles, rhonchi. No retractions. No resp. distress. No accessory muscle use. EXTR: No c/c/e NEURO favoring right ankle PSYCH: Normally interactive. Conversant. Not depressed or anxious appearing.  Calm demeanor. Right ankle is swollen and tender laterally, and she has a lateral bruise.  Achilles is intact.  Foot is slightly tender over 5th MT  [facility] reading (PRIMARY) by  Dr. Patsy Lager[place]. Right ankle: negative for fracture.  Calcaneous  spur Right foot: negative for fracture, as above  RIGHT FOOT COMPLETE - 3+ VIEW  COMPARISON: 08/19/1012.  FINDINGS:  No evidence of acute fracture or dislocation. Well preserved joint  spaces. Well preserved bone mineral density. Moderate-sized plantar  calcaneal spur with calcification at the insertion of the plantar  fascia on the calcaneus, unchanged. No other intrinsic osseous  abnormalities.  IMPRESSION:  No acute osseous abnormality. moderate-sized plantar calcaneal spur.  Stable since August, 2014.  No significant discrepancy with the original interpretation by Dr.  Patsy Lager.   RIGHT ANKLE - COMPLETE 3+ VIEW COMPARISON: None.  FINDINGS: Moderate lateral soft tissue swelling.  Mortise is intact. No fracture or dislocation. Small to moderate heel spur.  IMPRESSION: No acute findings, heel spur.  Assessment and Plan: Right ankle injury, initial encounter - Plan: DG Ankle Complete Right, DG Foot Complete Right, traMADol (ULTRAM) 50 MG tablet  Treat for ankle sprain with tramadol and CAM boot.  She tried crutches but did not want to take them home  See patient instructions for more details.     Signed Abbe Amsterdam, MD

## 2014-01-03 ENCOUNTER — Telehealth: Payer: PRIVATE HEALTH INSURANCE | Admitting: Family

## 2014-01-03 DIAGNOSIS — J019 Acute sinusitis, unspecified: Secondary | ICD-10-CM

## 2014-01-03 MED ORDER — AMOXICILLIN-POT CLAVULANATE 875-125 MG PO TABS
1.0000 | ORAL_TABLET | Freq: Two times a day (BID) | ORAL | Status: DC
Start: 2014-01-03 — End: 2014-03-29

## 2014-01-03 NOTE — Progress Notes (Signed)

## 2014-03-28 ENCOUNTER — Encounter: Payer: Self-pay | Admitting: Family

## 2014-03-29 ENCOUNTER — Ambulatory Visit (INDEPENDENT_AMBULATORY_CARE_PROVIDER_SITE_OTHER): Payer: PRIVATE HEALTH INSURANCE | Admitting: Family

## 2014-03-29 ENCOUNTER — Encounter: Payer: Self-pay | Admitting: Family

## 2014-03-29 VITALS — BP 140/96 | HR 101 | Temp 98.2°F | Resp 16 | Ht 64.5 in | Wt 267.8 lb

## 2014-03-29 DIAGNOSIS — F329 Major depressive disorder, single episode, unspecified: Secondary | ICD-10-CM

## 2014-03-29 DIAGNOSIS — F32A Depression, unspecified: Secondary | ICD-10-CM

## 2014-03-29 MED ORDER — ESCITALOPRAM OXALATE 10 MG PO TABS: ORAL_TABLET | ORAL | Status: DC

## 2014-03-29 MED ORDER — BENZONATATE 100 MG PO CAPS: 100.0000 mg | ORAL_CAPSULE | Freq: Three times a day (TID) | ORAL | Status: DC | PRN

## 2014-03-29 NOTE — Assessment & Plan Note (Signed)
Uncontrolled.  Will initiate lexapro 10mg .  I instructed pt to start 1/2 tab by mouth once daily for 1 week, then increase to a full tab once daily. We discussed rare but serious side effect of suicide ideation.  She is instructed to discontinue medication go directly to ED if this occurs.  Pt verbalizes understanding.  Plan follow up in 1 month to evaluate progress.

## 2014-03-29 NOTE — Progress Notes (Signed)
Pre visit review using our clinic review tool, if applicable. No additional management support is needed unless otherwise documented below in the visit note. 

## 2014-03-29 NOTE — Patient Instructions (Signed)
Add tessalon as needed for cough. Call if symptoms worsen or if not improved in 2-3 days. Start lexapro 1/2 tab by mouth once daily for 1 week, then increase to a full tab once daily on week two. Follow up in 1 month.

## 2014-03-29 NOTE — Progress Notes (Signed)
Subjective:    Patient ID: Jean Bolton, female    DOB: 10-27-1970, 44 y.o.   MRN: [id number]  HPI  Jean Bolton is a 44 yr old female who presents today with report of cough/body aches Friday and Saturday which have now resolved. Feels fatigued, had some chest "rattling/wheezing" for a few days- now resolved.  Slight nasal congestion.  Cough is productive on occasion of green mucous. Mils SOB- had some wheezing yesterday and the day before. Now she denies sore throat ear pain, sinus pressure.    Depression- reports that [facility].  Fels like she has more anxiety than depression.  Does not sleep well, trouble falling asleep.  She reports that prior to abilify she was on zoloft which helped her but then felt too unmotivated.  Denies SI/HI  175/118. Took alkaselzer and theraflu.  Wt Readings from Last 3 Encounters:  03/29/14 267 lb 12.8 oz (121.473 kg)  08/02/13 249 lb (112.946 kg)  06/07/13 243 lb (110.224 kg)     Review of Systems    see HPI  Past Medical History  Diagnosis Date  . Depression   . Arthritis   . Diverticulitis 2012    History   Social History  . Marital Status: Single    Spouse Name: N/A  . Number of Children: N/A  . Years of Education: N/A   Occupational History  . Not on file.   Social History Main Topics  . Smoking status: Current Some Day Smoker  . Smokeless tobacco: Never Used     Comment: Pt is currently vaping.  . Alcohol Use: 1.5 - 3.0 oz/week    3-6 drink(s) per week  . Drug Use: No  . Sexual Activity: Not on file   Other Topics Concern  . 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  Family History  Problem Relation Age of Onset  . Diabetes Mother   . Hypertension Mother   . Arthritis Mother   . Hypertension Father   .  Arthritis Father   . Alcohol abuse Father   . Heart attack Neg Hx   . Sudden death Neg Hx   . Hypertension Maternal Grandmother   . Hypertension Maternal Grandfather   . Arthritis Maternal Grandfather     No Known Allergies  No current outpatient prescriptions on file prior to visit.   No current facility-administered medications on file prior to visit.    BP 140/96 mmHg  Pulse 101  Temp(Src) 98.2 F (36.8 C) (Oral)  Resp 16  Ht 5' 4.5" (1.638 m)  Wt 267 lb 12.8 oz (121.473 kg)  BMI 45.27 kg/m2  SpO2 99%  LMP 03/29/2014    Objective:   Physical Exam  Constitutional: She is oriented to person, place, and time. She appears well-developed and well-nourished.  HENT:  Right Ear: Tympanic membrane and ear canal normal.  Left Ear: Tympanic membrane and ear canal normal.  Mouth/Throat: No oropharyngeal exudate, posterior oropharyngeal edema or posterior oropharyngeal erythema.  Eyes: No scleral icterus.  Cardiovascular: Normal rate, regular rhythm and normal heart sounds.   No murmur heard. Pulmonary/Chest: Effort normal and breath sounds normal. No respiratory distress. She has no wheezes.  Lymphadenopathy:    She has no cervical adenopathy.  Neurological: She is alert and oriented to person, place, and time.  Psychiatric: Judgment and thought content normal. Cognition and memory are normal.  Slightly flat affect, tearful intermittently          Assessment & Plan:  Viral URI- symptoms most consistent with viral uri. Advised pt: Add tessalon as needed for cough. Call if symptoms worsen or if not improved in 2-3 days.

## 2014-05-02 ENCOUNTER — Encounter: Payer: Self-pay | Admitting: Family

## 2014-05-02 ENCOUNTER — Ambulatory Visit (INDEPENDENT_AMBULATORY_CARE_PROVIDER_SITE_OTHER): Payer: PRIVATE HEALTH INSURANCE | Admitting: Family

## 2014-05-02 VITALS — BP 130/90 | HR 70 | Temp 97.8°F | Resp 18 | Ht 64.5 in | Wt 273.2 lb

## 2014-05-02 DIAGNOSIS — F32A Depression, unspecified: Secondary | ICD-10-CM

## 2014-05-02 DIAGNOSIS — IMO0001 Reserved for inherently not codable concepts without codable children: Secondary | ICD-10-CM | POA: Insufficient documentation

## 2014-05-02 DIAGNOSIS — R030 Elevated blood-pressure reading, without diagnosis of hypertension: Secondary | ICD-10-CM

## 2014-05-02 DIAGNOSIS — R635 Abnormal weight gain: Secondary | ICD-10-CM | POA: Diagnosis not present

## 2014-05-02 DIAGNOSIS — F329 Major depressive disorder, single episode, unspecified: Secondary | ICD-10-CM | POA: Diagnosis not present

## 2014-05-02 LAB — TSH: TSH: 3.14 u[IU]/mL (ref 0.35–4.50)

## 2014-05-02 MED ORDER — ESCITALOPRAM OXALATE 10 MG PO TABS: ORAL_TABLET | ORAL | Status: DC

## 2014-05-02 NOTE — Progress Notes (Signed)
Subjective:    Patient ID: Jean Bolton, female    DOB: [birth year], 44 y.o.   MRN: [id number]  HPI  Jean Bolton is a 44 yr old female who presents.  Depression- sleeping better.  Notes mood is better. Tolerating without side effects.   Lost her job last week.  Feels relieved about this, her job was major source of stress.  Weight gain- Has had significant weight gain. Reports that at work she would eat junk food.   Wt Readings from Last 3 Encounters:  05/02/14 273 lb 3.2 oz (123.923 kg)  03/29/14 267 lb 12.8 oz (121.473 kg)  08/02/13 249 lb (112.946 kg)   Elevated BP- Not on BP meds.  BP Readings from Last 3 Encounters:  05/02/14 130/90  03/29/14 140/96  08/02/13 122/82     Review of Systems See HPI  Past Medical History  Diagnosis Date  . Depression   . Arthritis   . Diverticulitis 2012    History   Social History  . Marital Status: Single    Spouse Name: N/A  . Number of Children: N/A  . Years of Education: N/A   Occupational History  . Not on file.   Social History Main Topics  . Smoking status: Current Some Day Smoker  . Smokeless tobacco: Never Used     Comment: Pt is currently vaping.  . Alcohol Use: 1.5 - 3.0 oz/week    3-6 drink(s) per week  . Drug Use: No  . Sexual Activity: Not on file   Other Topics Concern  . 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  Family History  Problem Relation Age of Onset  . Diabetes Mother   . Hypertension Mother   . Arthritis Mother   . Hypertension Father   . Arthritis Father   . Alcohol abuse Father   . Heart attack Neg Hx   . Sudden death Neg Hx   . Hypertension Maternal Grandmother   . Hypertension Maternal Grandfather   . Arthritis Maternal Grandfather     No Known Allergies  Current Outpatient Prescriptions on File Prior to Visit    Medication Sig Dispense Refill  . escitalopram (LEXAPRO) 10 MG tablet 1/2 tab by mouth once daily for 1 week, then increase to a full tab once daily on week two 30 tablet 0   No current facility-administered medications on file prior to visit.    BP 130/90 mmHg  Pulse 70  Temp(Src) 97.8 F (36.6 C) (Oral)  Resp 18  Ht 5' 4.5" (1.638 m)  Wt 273 lb 3.2 oz (123.923 kg)  BMI 46.19 kg/m2  SpO2 98%  LMP 04/25/2014       Objective:   Physical Exam  Constitutional: She is oriented to person, place, and time. She appears well-developed and well-nourished.  HENT:  Head: Normocephalic and atraumatic.  Cardiovascular: Normal rate, regular rhythm and normal heart sounds.   No murmur heard. Pulmonary/Chest: Effort normal and breath sounds normal. No respiratory distress. She has no wheezes.  Musculoskeletal: She exhibits no edema.  Neurological: She is alert and oriented to person, place, and time.  Psychiatric: She has a normal mood and affect. Her behavior is normal. Judgment and thought content normal.  Assessment & Plan:

## 2014-05-02 NOTE — Patient Instructions (Signed)
Continue lexapro. Work on adding 30 minutes of walking at least 5 days a week.

## 2014-05-02 NOTE — Progress Notes (Signed)
Pre visit review using our clinic review tool, if applicable. No additional management support is needed unless otherwise documented below in the visit note. 

## 2014-05-02 NOTE — Assessment & Plan Note (Signed)
I cautioned her to watcher weight with lexapro, though I am not certain this is the cause of her weight gain. She plans to begin eating healthier. Discussed addition of exercise. Obtain tsh to rule out hypothyroid as a contributing cause.

## 2014-05-02 NOTE — Assessment & Plan Note (Signed)
Improved on lexapro. Continue same.  

## 2014-05-02 NOTE — Assessment & Plan Note (Signed)
BP looks ok today, continue to monitor.

## 2014-05-03 ENCOUNTER — Encounter: Payer: Self-pay | Admitting: Family

## 2014-08-01 ENCOUNTER — Ambulatory Visit: Payer: PRIVATE HEALTH INSURANCE | Admitting: Family

## 2014-08-01 ENCOUNTER — Telehealth: Payer: Self-pay | Admitting: Family

## 2014-08-05 NOTE — Telephone Encounter (Signed)
Yes please

## 2014-08-05 NOTE — Telephone Encounter (Signed)
Pt was no show 08/01/14 8:00, follow up appt, left msg for pt to call and reschedule, charge for no show?

## 2014-11-27 ENCOUNTER — Inpatient Hospital Stay (HOSPITAL_COMMUNITY)
Admission: EM | Admit: 2014-11-27 | Discharge: 2014-11-29 | DRG: 419 | Disposition: A | Discharge status: Home / Self Care | Payer: Self-pay | Attending: Surgery | Admitting: Surgery

## 2014-11-27 ENCOUNTER — Encounter (HOSPITAL_COMMUNITY): Payer: Self-pay | Admitting: Emergency Medicine

## 2014-11-27 ENCOUNTER — Emergency Department (HOSPITAL_COMMUNITY): Payer: Self-pay

## 2014-11-27 DIAGNOSIS — I10 Essential (primary) hypertension: Secondary | ICD-10-CM | POA: Diagnosis present

## 2014-11-27 DIAGNOSIS — Z79899 Other long term (current) drug therapy: Secondary | ICD-10-CM

## 2014-11-27 DIAGNOSIS — R1011 Right upper quadrant pain: Secondary | ICD-10-CM

## 2014-11-27 DIAGNOSIS — IMO0001 Reserved for inherently not codable concepts without codable children: Secondary | ICD-10-CM | POA: Diagnosis present

## 2014-11-27 DIAGNOSIS — F32A Depression, unspecified: Secondary | ICD-10-CM | POA: Diagnosis present

## 2014-11-27 DIAGNOSIS — R030 Elevated blood-pressure reading, without diagnosis of hypertension: Secondary | ICD-10-CM

## 2014-11-27 DIAGNOSIS — K8000 Calculus of gallbladder with acute cholecystitis without obstruction: Principal | ICD-10-CM | POA: Diagnosis present

## 2014-11-27 DIAGNOSIS — K819 Cholecystitis, unspecified: Secondary | ICD-10-CM

## 2014-11-27 DIAGNOSIS — K812 Acute cholecystitis with chronic cholecystitis: Secondary | ICD-10-CM | POA: Diagnosis present

## 2014-11-27 DIAGNOSIS — F17200 Nicotine dependence, unspecified, uncomplicated: Secondary | ICD-10-CM | POA: Diagnosis present

## 2014-11-27 DIAGNOSIS — F329 Major depressive disorder, single episode, unspecified: Secondary | ICD-10-CM | POA: Diagnosis present

## 2014-11-27 DIAGNOSIS — K8020 Calculus of gallbladder without cholecystitis without obstruction: Secondary | ICD-10-CM | POA: Diagnosis present

## 2014-11-27 DIAGNOSIS — Z720 Tobacco use: Secondary | ICD-10-CM | POA: Diagnosis present

## 2014-11-27 LAB — CBC
HEMATOCRIT: 41.4 % (ref 36.0–46.0)
HEMOGLOBIN: 13.5 g/dL (ref 12.0–15.0)
MCH: 30.3 pg (ref 26.0–34.0)
MCHC: 32.6 g/dL (ref 30.0–36.0)
MCV: 93 fL (ref 78.0–100.0)
Platelets: 292 10*3/uL (ref 150–400)
RBC: 4.45 MIL/uL (ref 3.87–5.11)
RDW: 13.7 % (ref 11.5–15.5)
WBC: 13.3 10*3/uL — ABNORMAL HIGH (ref 4.0–10.5)

## 2014-11-27 LAB — I-STAT BETA HCG BLOOD, ED (MC, WL, AP ONLY)

## 2014-11-27 MED ORDER — SODIUM CHLORIDE 0.9 % IV BOLUS (SEPSIS)
1000.0000 mL | Freq: Once | INTRAVENOUS | Status: AC
  Administered 2014-11-27: 1000 mL via INTRAVENOUS

## 2014-11-27 MED ORDER — ONDANSETRON HCL 4 MG/2ML IJ SOLN
4.0000 mg | Freq: Once | INTRAMUSCULAR | Status: AC
  Administered 2014-11-27: 4 mg via INTRAVENOUS
  Filled 2014-11-27: qty 2

## 2014-11-27 MED ORDER — MORPHINE SULFATE (PF) 4 MG/ML IV SOLN
4.0000 mg | Freq: Once | INTRAVENOUS | Status: AC
  Administered 2014-11-27: 4 mg via INTRAVENOUS
  Filled 2014-11-27: qty 1

## 2014-11-27 NOTE — ED Provider Notes (Addendum)
CSN: [id number]   Arrival date & time 11/27/14 2309  History  By signing my name below, I, Jean Bolton, attest that this documentation has been prepared under the direction and in the presence of Jean Batonourtney F Horton, MD. Electronically Signed: Bethel BornBritney Bolton, ED Scribe. 11/28/2014. 12:48 AM.  Chief Complaint  Patient presents with  . Abdominal Pain    HPI The history is provided by the patient. No language interpreter was used.   Jean Bolton is a 44 y.o. female with history of diverticulitis and cholelithiasis who presents to the Emergency Department complaining of constant, "15/10", right-sided abdominal pain that radiates to the back and shoulder with onset around 8:30 PM tonight [facility]. Pt is unable to describe the pain stating "it just hurts". She has had similar pain in the past associated with her gallbladder but notes that it is not typically this severe. Pt was advised of her need for a cholecystectomy in the past but has been unable to have one.  Associated symptoms include nausea and vomiting. Pt denies diarrhea, hematochezia, and hematuria. She takes no daily medication and has no known drug allergies.   Past Medical History  Diagnosis Date  . Depression   . Arthritis   . Diverticulitis 2012    History reviewed. No pertinent past surgical history.  Family History  Problem Relation Age of Onset  . Diabetes Mother   . Hypertension Mother   . Arthritis Mother   . Hypertension Father   . Arthritis Father   . Alcohol abuse Father   . Heart attack Neg Hx   . Sudden death Neg Hx   . Hypertension Maternal Grandmother   . Hypertension Maternal Grandfather   . Arthritis Maternal Grandfather     Social History  Substance Use Topics  . Smoking status: Current Some Day Smoker  . Smokeless tobacco: Never Used     Comment: Pt is currently vaping.  . Alcohol Use: 1.5 - 3.0 oz/week    3-6 Standard drinks or equivalent per week      Review of Systems  Constitutional: Negative for fever.  Respiratory: Negative for shortness of breath.   Cardiovascular: Negative for chest pain.  Gastrointestinal: Positive for nausea, vomiting and abdominal pain. Negative for diarrhea and blood in stool.  Genitourinary: Negative for hematuria.  All other systems reviewed and are negative.  Home Medications   Prior to Admission medications   Medication Sig Start Date End Date Taking? Authorizing Provider  escitalopram (LEXAPRO) 10 MG tablet Take 1 tablet once daily Patient not taking: Reported on 11/28/2014 05/02/14   Sandford CrazeMelissa O'Sullivan, NP    Allergies  Review of patient's allergies indicates no known allergies.  Triage Vitals: BP 203/116 mmHg  Pulse 89  Temp(Src) 97.5 F (36.4 C) (Oral)  Resp 16  SpO2 98%  LMP 11/13/2014 (Approximate)  Physical Exam  Constitutional: She is oriented to person, place, and time. She appears well-developed and well-nourished.  Obese, uncomfortable appearing  HENT:  Head: Normocephalic and atraumatic.  Cardiovascular: Normal rate, regular rhythm and normal heart sounds.   No murmur heard. Pulmonary/Chest: Effort normal and breath sounds normal. No respiratory distress. She has no wheezes.  Abdominal: Soft. Bowel sounds are normal. There is tenderness. There is no rebound and no guarding.  Right upper quadrant tenderness palpation, no rebound or guarding  Musculoskeletal: She exhibits no edema.  Neurological: She is alert and oriented to person, place, and time.  Skin: Skin is warm and dry.  Psychiatric:  She has a normal mood and affect.  Nursing note and vitals reviewed.   ED Course  Procedures   DIAGNOSTIC STUDIES: Oxygen Saturation is 98% on RA, normal by my interpretation.    COORDINATION OF CARE: 11:34 PM Discussed treatment plan which includes lab work, abdominal US, and pain management with pt at bedside and pt agreed to plan.  12:48 AM I re-evaluated the patient and  provided an update on the results of her [place] and the plan to consult Surgery.  Labs Reviewed  COMPREHENSIVE METABOLIC PANEL - Abnormal; Notable for the following:    Glucose, Bld 146 (*)    Calcium 8.8 (*)    All other components within normal limits  CBC - Abnormal; Notable for the following:    WBC 13.3 (*)    All other components within normal limits  LIPASE, BLOOD  URINALYSIS, ROUTINE W REFLEX MICROSCOPIC (NOT AT [facility])  PREGNANCY, URINE  I-STAT BETA HCG BLOOD, ED ([facility], [facility], [facility] ONLY)    Imaging Review US Abdomen Limited Ruq  11/28/2014  CLINICAL DATA:  Right upper quadrant pain for 4 hours. EXAM: US ABDOMEN LIMITED - RIGHT UPPER QUADRANT COMPARISON:  None. FINDINGS: Gallbladder: Cholelithiasis with stones demonstrated in the gallbladder neck. Largest stone is measured at 2.2 cm diameter. Stones are nonmobile. There is diffuse gallbladder wall thickening with pericholecystic edema. Murphy's sign is negative. Common bile duct: Diameter: 3.9 mm, normal Liver: No focal lesion identified. Within normal limits in parenchymal echogenicity. IMPRESSION: Cholelithiasis with stones in the gallbladder neck. Diffuse gallbladder wall thickening with pericholecystic edema. Changes are nonspecific but consistent with acute cholecystitis in the appropriate clinical setting. Electronically Signed   By: Burman Nieves M.D.   On: 11/28/2014 00:18   EKG Interpretation  Date/Time:  Monday November 28 2014 00:41:40 EST Ventricular Rate:  68 PR Interval:  151 QRS Duration: 96 QT Interval:  434 QTC Calculation: 462 R Axis:   97 Text Interpretation:  Sinus rhythm Borderline right axis deviation Minimal ST elevation inferiorly No prior for comparison Confirmed by HORTON  MD, Toni Amend ([id number]) on 11/28/2014 12:56:05 AM   I personally reviewed and evaluated these images and lab results as a part of my medical decision-making.   MDM   Final diagnoses:  RUQ pain  Cholecystitis    Patient presents with  right upper quadrant pain after eating. History of cholelithiasis. Tender on exam. Otherwise nontoxic. Was noted to be hypertensive. Patient was given pain medication and fluids. Right upper quadrant ultrasound shows findings consistent with acute cholecystitis.  Patient has a leukocytosis to 13.3. Otherwise LFTs and lipase are normal. Will consult general surgery. Anticipate admission for pain control and possible cholecystectomy.  I personally performed the services described in this documentation, which was scribed in my presence. The recorded information has been reviewed and is accurate.    Jean Baton, MD 11/28/14 4098  Jean Baton, MD 11/28/14 [phone]

## 2014-11-27 NOTE — ED Notes (Signed)
Pt. reports right abdominal pain and upper back pain with emesis onset this evening , denies diarrhea or fever.

## 2014-11-28 ENCOUNTER — Inpatient Hospital Stay (HOSPITAL_COMMUNITY): Payer: PRIVATE HEALTH INSURANCE | Admitting: Certified Registered Nurse Anesthetist

## 2014-11-28 ENCOUNTER — Encounter (HOSPITAL_COMMUNITY): Admission: EM | Disposition: A | Discharge status: Home / Self Care | Payer: Self-pay | Source: Home / Self Care

## 2014-11-28 ENCOUNTER — Encounter (HOSPITAL_COMMUNITY): Payer: Self-pay | Admitting: Certified Registered Nurse Anesthetist

## 2014-11-28 ENCOUNTER — Inpatient Hospital Stay (HOSPITAL_COMMUNITY): Payer: Self-pay | Admitting: Certified Registered Nurse Anesthetist

## 2014-11-28 DIAGNOSIS — K812 Acute cholecystitis with chronic cholecystitis: Secondary | ICD-10-CM | POA: Diagnosis present

## 2014-11-28 HISTORY — PX: CHOLECYSTECTOMY: SHX55

## 2014-11-28 LAB — COMPREHENSIVE METABOLIC PANEL
ALT: 31 U/L (ref 14–54)
AST: 28 U/L (ref 15–41)
Albumin: 3.8 g/dL (ref 3.5–5.0)
Alkaline Phosphatase: 67 U/L (ref 38–126)
Anion gap: 9 (ref 5–15)
BUN: 14 mg/dL (ref 6–20)
CO2: 24 mmol/L (ref 22–32)
CREATININE: 0.82 mg/dL (ref 0.44–1.00)
Calcium: 8.8 mg/dL — ABNORMAL LOW (ref 8.9–10.3)
Chloride: 104 mmol/L (ref 101–111)
GFR calc Af Amer: >60 mL/min (ref >60)
Glucose, Bld: 146 mg/dL — ABNORMAL HIGH (ref 65–99)
Potassium: 4 mmol/L (ref 3.5–5.1)
Sodium: 137 mmol/L (ref 135–145)
TOTAL PROTEIN: 7 g/dL (ref 6.5–8.1)
Total Bilirubin: 0.3 mg/dL (ref 0.3–1.2)

## 2014-11-28 LAB — URINALYSIS, ROUTINE W REFLEX MICROSCOPIC
BILIRUBIN URINE: NEGATIVE
Glucose, UA: 100 mg/dL — AB
HGB URINE DIPSTICK: NEGATIVE
Ketones, ur: NEGATIVE mg/dL
Leukocytes, UA: NEGATIVE
Nitrite: NEGATIVE
PROTEIN: NEGATIVE mg/dL
Specific Gravity, Urine: 1.013 (ref 1.005–1.030)
pH: 7.5 (ref 5.0–8.0)

## 2014-11-28 LAB — SURGICAL PCR SCREEN
MRSA, PCR: NEGATIVE
STAPHYLOCOCCUS AUREUS: NEGATIVE

## 2014-11-28 LAB — PREGNANCY, URINE: Preg Test, Ur: NEGATIVE

## 2014-11-28 LAB — LIPASE, BLOOD: Lipase: 41 U/L (ref 11–51)

## 2014-11-28 SURGERY — LAPAROSCOPIC CHOLECYSTECTOMY
Anesthesia: General | Site: Abdomen

## 2014-11-28 MED ORDER — SUCCINYLCHOLINE CHLORIDE 20 MG/ML IJ SOLN
INTRAMUSCULAR | Status: DC | PRN
  Administered 2014-11-28: 100 mg via INTRAVENOUS

## 2014-11-28 MED ORDER — ONDANSETRON HCL 4 MG/2ML IJ SOLN
INTRAMUSCULAR | Status: AC
  Filled 2014-11-28: qty 2

## 2014-11-28 MED ORDER — HYDROMORPHONE HCL 1 MG/ML IJ SOLN
0.2500 mg | INTRAMUSCULAR | Status: DC | PRN
  Administered 2014-11-28: 0.5 mg via INTRAVENOUS

## 2014-11-28 MED ORDER — DIPHENHYDRAMINE HCL 50 MG/ML IJ SOLN
12.5000 mg | Freq: Four times a day (QID) | INTRAMUSCULAR | Status: DC | PRN
  Administered 2014-11-28: 12.5 mg via INTRAVENOUS
  Filled 2014-11-28: qty 1

## 2014-11-28 MED ORDER — BUPIVACAINE-EPINEPHRINE (PF) 0.25% -1:200000 IJ SOLN
INTRAMUSCULAR | Status: AC
  Filled 2014-11-28: qty 30

## 2014-11-28 MED ORDER — SUCCINYLCHOLINE CHLORIDE 20 MG/ML IJ SOLN
INTRAMUSCULAR | Status: AC
  Filled 2014-11-28: qty 1

## 2014-11-28 MED ORDER — ROCURONIUM BROMIDE 50 MG/5ML IV SOLN
INTRAVENOUS | Status: AC
  Filled 2014-11-28: qty 1

## 2014-11-28 MED ORDER — FENTANYL CITRATE (PF) 100 MCG/2ML IJ SOLN
INTRAMUSCULAR | Status: DC | PRN
  Administered 2014-11-28: 100 ug via INTRAVENOUS
  Administered 2014-11-28 (×3): 50 ug via INTRAVENOUS

## 2014-11-28 MED ORDER — PROPOFOL 10 MG/ML IV BOLUS
INTRAVENOUS | Status: DC | PRN
  Administered 2014-11-28: 200 mg via INTRAVENOUS

## 2014-11-28 MED ORDER — SODIUM CHLORIDE 0.9 % IV SOLN
INTRAVENOUS | Status: DC
  Administered 2014-11-28: 04:00:00 via INTRAVENOUS

## 2014-11-28 MED ORDER — SODIUM CHLORIDE 0.9 % IV SOLN
INTRAVENOUS | Status: DC | PRN
  Administered 2014-11-28: 50 mL

## 2014-11-28 MED ORDER — NEOSTIGMINE METHYLSULFATE 10 MG/10ML IV SOLN
INTRAVENOUS | Status: DC | PRN
  Administered 2014-11-28: 5 mg via INTRAVENOUS

## 2014-11-28 MED ORDER — 0.9 % SODIUM CHLORIDE (POUR BTL) OPTIME
TOPICAL | Status: DC | PRN
  Administered 2014-11-28: 1000 mL

## 2014-11-28 MED ORDER — MIDAZOLAM HCL 5 MG/5ML IJ SOLN
INTRAMUSCULAR | Status: DC | PRN
  Administered 2014-11-28: 2 mg via INTRAVENOUS

## 2014-11-28 MED ORDER — LIDOCAINE HCL (CARDIAC) 20 MG/ML IV SOLN
INTRAVENOUS | Status: AC
  Filled 2014-11-28: qty 5

## 2014-11-28 MED ORDER — PROPOFOL 10 MG/ML IV BOLUS
INTRAVENOUS | Status: AC
  Filled 2014-11-28: qty 20

## 2014-11-28 MED ORDER — HYDROMORPHONE HCL 1 MG/ML IJ SOLN: 1.0000 mg | INTRAMUSCULAR | Status: DC | PRN

## 2014-11-28 MED ORDER — SODIUM CHLORIDE 0.9 % IR SOLN
Status: DC | PRN
  Administered 2014-11-28: 1000 mL

## 2014-11-28 MED ORDER — PROMETHAZINE HCL 25 MG/ML IJ SOLN: 6.2500 mg | INTRAMUSCULAR | Status: DC | PRN

## 2014-11-28 MED ORDER — ONDANSETRON HCL 4 MG/2ML IJ SOLN
4.0000 mg | Freq: Four times a day (QID) | INTRAMUSCULAR | Status: DC | PRN
  Administered 2014-11-28: 4 mg via INTRAVENOUS

## 2014-11-28 MED ORDER — BUPIVACAINE-EPINEPHRINE 0.25% -1:200000 IJ SOLN
INTRAMUSCULAR | Status: DC | PRN
  Administered 2014-11-28: 5 mL

## 2014-11-28 MED ORDER — OXYCODONE-ACETAMINOPHEN 5-325 MG PO TABS
1.0000 | ORAL_TABLET | ORAL | Status: DC | PRN
  Administered 2014-11-28 – 2014-11-29 (×3): 1 via ORAL
  Filled 2014-11-28 (×3): qty 1

## 2014-11-28 MED ORDER — GLYCOPYRROLATE 0.2 MG/ML IJ SOLN
INTRAMUSCULAR | Status: DC | PRN
  Administered 2014-11-28: .8 mg via INTRAVENOUS

## 2014-11-28 MED ORDER — PHENYLEPHRINE 40 MCG/ML (10ML) SYRINGE FOR IV PUSH (FOR BLOOD PRESSURE SUPPORT)
PREFILLED_SYRINGE | INTRAVENOUS | Status: AC
  Filled 2014-11-28: qty 10

## 2014-11-28 MED ORDER — HYDROMORPHONE HCL 1 MG/ML IJ SOLN
INTRAMUSCULAR | Status: AC
  Filled 2014-11-28: qty 1

## 2014-11-28 MED ORDER — HYDROMORPHONE HCL 1 MG/ML IJ SOLN
1.0000 mg | Freq: Once | INTRAMUSCULAR | Status: AC
  Administered 2014-11-28: 1 mg via INTRAVENOUS
  Filled 2014-11-28: qty 1

## 2014-11-28 MED ORDER — FENTANYL CITRATE (PF) 250 MCG/5ML IJ SOLN
INTRAMUSCULAR | Status: AC
  Filled 2014-11-28: qty 5

## 2014-11-28 MED ORDER — MIDAZOLAM HCL 2 MG/2ML IJ SOLN
INTRAMUSCULAR | Status: AC
  Filled 2014-11-28: qty 2

## 2014-11-28 MED ORDER — EPHEDRINE SULFATE 50 MG/ML IJ SOLN
INTRAMUSCULAR | Status: AC
  Filled 2014-11-28: qty 1

## 2014-11-28 MED ORDER — ROCURONIUM BROMIDE 100 MG/10ML IV SOLN
INTRAVENOUS | Status: DC | PRN
  Administered 2014-11-28: 10 mg via INTRAVENOUS
  Administered 2014-11-28: 40 mg via INTRAVENOUS

## 2014-11-28 MED ORDER — HYDROMORPHONE HCL 1 MG/ML IJ SOLN
1.0000 mg | INTRAMUSCULAR | Status: DC | PRN
  Administered 2014-11-28 (×2): 1 mg via INTRAVENOUS
  Filled 2014-11-28 (×2): qty 1

## 2014-11-28 MED ORDER — DEXTROSE 5 % IV SOLN
2.0000 g | INTRAVENOUS | Status: DC
  Administered 2014-11-28 – 2014-11-29 (×2): 2 g via INTRAVENOUS
  Filled 2014-11-28 (×3): qty 2

## 2014-11-28 MED ORDER — LIDOCAINE HCL (CARDIAC) 20 MG/ML IV SOLN
INTRAVENOUS | Status: DC | PRN
  Administered 2014-11-28: 100 mg via INTRAVENOUS

## 2014-11-28 MED ORDER — ENOXAPARIN SODIUM 40 MG/0.4ML ~~LOC~~ SOLN
40.0000 mg | SUBCUTANEOUS | Status: DC
  Administered 2014-11-29: 40 mg via SUBCUTANEOUS

## 2014-11-28 MED ORDER — PHENYLEPHRINE HCL 10 MG/ML IJ SOLN
INTRAMUSCULAR | Status: DC | PRN
  Administered 2014-11-28 (×2): 80 ug via INTRAVENOUS

## 2014-11-28 MED ORDER — ONDANSETRON 4 MG PO TBDP: 4.0000 mg | ORAL_TABLET | Freq: Four times a day (QID) | ORAL | Status: DC | PRN

## 2014-11-28 MED ORDER — MORPHINE SULFATE (PF) 2 MG/ML IV SOLN
1.0000 mg | INTRAVENOUS | Status: DC | PRN
  Administered 2014-11-28: 2 mg via INTRAVENOUS
  Administered 2014-11-28: 4 mg via INTRAVENOUS
  Administered 2014-11-28: 2 mg via INTRAVENOUS
  Administered 2014-11-29 (×2): 4 mg via INTRAVENOUS
  Filled 2014-11-28 (×4): qty 2
  Filled 2014-11-28: qty 1

## 2014-11-28 MED ORDER — LACTATED RINGERS IV SOLN
INTRAVENOUS | Status: DC
  Administered 2014-11-28 (×2): via INTRAVENOUS

## 2014-11-28 SURGICAL SUPPLY — 41 items
APPLIER CLIP ROT 10 11.4 M/L (STAPLE) ×4
BLADE SURG ROTATE 9660 (MISCELLANEOUS) IMPLANT
CANISTER SUCTION 2500CC (MISCELLANEOUS) ×4 IMPLANT
CHLORAPREP W/TINT 26ML (MISCELLANEOUS) ×4 IMPLANT
CLIP APPLIE ROT 10 11.4 M/L (STAPLE) ×2 IMPLANT
COVER MAYO STAND STRL (DRAPES) ×4 IMPLANT
COVER SURGICAL LIGHT HANDLE (MISCELLANEOUS) ×4 IMPLANT
DERMABOND ADHESIVE PROPEN (GAUZE/BANDAGES/DRESSINGS) ×2
DERMABOND ADVANCED .7 DNX6 (GAUZE/BANDAGES/DRESSINGS) ×2 IMPLANT
DRAPE C-ARM 42X72 X-RAY (DRAPES) ×4 IMPLANT
DRAPE WARM FLUID 44X44 (DRAPE) IMPLANT
ELECT REM PT RETURN 9FT ADLT (ELECTROSURGICAL) ×4
ELECTRODE REM PT RTRN 9FT ADLT (ELECTROSURGICAL) ×2 IMPLANT
GLOVE BIO SURGEON STRL SZ7 (GLOVE) ×4 IMPLANT
GLOVE BIO SURGEON STRL SZ8 (GLOVE) ×8 IMPLANT
GLOVE BIOGEL PI IND STRL 8 (GLOVE) ×2 IMPLANT
GLOVE BIOGEL PI INDICATOR 8 (GLOVE) ×2
GLOVE NEODERM STER SZ 7 (GLOVE) ×4 IMPLANT
GOWN STRL REUS W/ TWL LRG LVL3 (GOWN DISPOSABLE) ×4 IMPLANT
GOWN STRL REUS W/ TWL XL LVL3 (GOWN DISPOSABLE) ×2 IMPLANT
GOWN STRL REUS W/TWL LRG LVL3 (GOWN DISPOSABLE) ×4
GOWN STRL REUS W/TWL XL LVL3 (GOWN DISPOSABLE) ×2
KIT BASIN OR (CUSTOM PROCEDURE TRAY) ×4 IMPLANT
KIT ROOM TURNOVER OR (KITS) ×4 IMPLANT
LIQUID BAND (GAUZE/BANDAGES/DRESSINGS) ×4 IMPLANT
NS IRRIG 1000ML POUR BTL (IV SOLUTION) ×4 IMPLANT
PAD ARMBOARD 7.5X6 YLW CONV (MISCELLANEOUS) ×4 IMPLANT
POUCH SPECIMEN RETRIEVAL 10MM (ENDOMECHANICALS) ×4 IMPLANT
SCISSORS LAP 5X35 DISP (ENDOMECHANICALS) ×4 IMPLANT
SET CHOLANGIOGRAPH 5 50 .035 (SET/KITS/TRAYS/PACK) ×4 IMPLANT
SET IRRIG TUBING LAPAROSCOPIC (IRRIGATION / IRRIGATOR) ×4 IMPLANT
SLEEVE ENDOPATH XCEL 5M (ENDOMECHANICALS) ×4 IMPLANT
SPECIMEN JAR SMALL (MISCELLANEOUS) ×4 IMPLANT
SUT MNCRL AB 4-0 PS2 18 (SUTURE) ×4 IMPLANT
TOWEL OR 17X24 6PK STRL BLUE (TOWEL DISPOSABLE) ×4 IMPLANT
TOWEL OR 17X26 10 PK STRL BLUE (TOWEL DISPOSABLE) ×4 IMPLANT
TRAY LAPAROSCOPIC MC (CUSTOM PROCEDURE TRAY) ×4 IMPLANT
TROCAR XCEL BLUNT TIP 100MML (ENDOMECHANICALS) ×4 IMPLANT
TROCAR XCEL NON-BLD 11X100MML (ENDOMECHANICALS) ×4 IMPLANT
TROCAR XCEL NON-BLD 5MMX100MML (ENDOMECHANICALS) ×4 IMPLANT
TUBING INSUFFLATION (TUBING) ×4 IMPLANT

## 2014-11-28 NOTE — Op Note (Signed)
Laparoscopic Cholecystectomy with IOC Procedure Note  Indications: This patient presents with symptomatic gallbladder disease and will undergo laparoscopic cholecystectomy. The procedure has been discussed with the patient. Operative and non operative treatments have been discussed. Risks of surgery include bleeding, infection,  Common bile duct injury,  Injury to the stomach,liver, colon,small intestine, abdominal wall,  Diaphragm,  Major blood vessels,  And the need for an open procedure.  Other risks include worsening of medical problems, death,  DVT and pulmonary embolism, and cardiovascular events.   Medical options have also been discussed. The patient has been informed of long term expectations of surgery and non surgical options,  The patient agrees to proceed.    Pre-operative Diagnosis: Calculus of gallbladder with acute cholecystitis, without mention of obstruction  Post-operative Diagnosis: Same  Surgeon: Izabela Ow A.   Assistants: none   Anesthesia: General endotracheal anesthesia and Local anesthesia 0.25.% bupivacaine, with epinephrine  ASA Class: 2  Procedure Details  The patient was seen again in the Holding Room. The risks, benefits, complications, treatment options, and expected outcomes were discussed with the patient. The possibilities of reaction to medication, pulmonary aspiration, perforation of viscus, bleeding, recurrent infection, finding a normal gallbladder, the need for additional procedures, failure to diagnose a condition, the possible need to convert to an open procedure, and creating a complication requiring transfusion or operation were discussed with the patient. The patient and/or family concurred with the proposed plan, giving informed consent. The site of surgery properly noted/marked. The patient was taken to Operating Room, identified as Jean Bolton and the procedure verified as Laparoscopic Cholecystectomy with Intraoperative Cholangiograms. A Time  Out was held and the above information confirmed.  Prior to the induction of general anesthesia, antibiotic prophylaxis was administered. General endotracheal anesthesia was then administered and tolerated well. After the induction, the abdomen was prepped in the usual sterile fashion. The patient was positioned in the supine position with the left arm comfortably tucked, along with some reverse Trendelenburg.  Local anesthetic agent was injected into the skin near the umbilicus and an incision made. The midline fascia was incised and the Hasson technique was used to introduce a 12 mm port under direct vision. It was secured with a figure of eight Vicryl suture placed in the usual fashion. Pneumoperitoneum was then created with CO2 and tolerated well without any adverse changes in the patient's vital signs. Additional trocars were introduced under direct vision with an 11 mm trocar in the epigastrium and 2 5 mm trocars in the right upper quadrant. All skin incisions were infiltrated with a local anesthetic agent before making the incision and placing the trocars.   The gallbladder was identified, the fundus grasped and retracted cephalad. Adhesions were lysed bluntly and with the electrocautery where indicated, taking care not to injure any adjacent organs or viscus. The infundibulum was grasped and retracted laterally, exposing the peritoneum overlying the triangle of Calot. This was then divided and exposed in a blunt fashion. The cystic duct was clearly identified and bluntly dissected circumferentially. The junctions of the gallbladder, cystic duct and common bile duct were clearly identified prior to the division of any linear structure.   Due to severe cholecystitis and a very frail appearing cystic duct a cholangiogram was not performed.  The critical view was obtained.   The cystic duct was then  ligated with surgical clips  on the patient side and  clipped on the gallbladder side and divided. The  cystic artery was identified, dissected free, ligated with  clips and divided as well. Posterior cystic artery clipped and divided.  The gallbladder was dissected from the liver bed in retrograde fashion with the electrocautery. The gallbladder was removed. The liver bed was irrigated and inspected. Hemostasis was achieved with the electrocautery. Copious irrigation was utilized and was repeatedly aspirated until clear all particulate matter. Hemostasis was achieved with no signs  Of bleeding or bile leakage.  Pneumoperitoneum was completely reduced after viewing removal of the trocars under direct vision. The wound was thoroughly irrigated and the fascia was then closed with a figure of eight suture; the skin was then closed with 4 O monocryl  and a sterile dressing was applied.  Instrument, sponge, and needle counts were correct at closure and at the conclusion of the case.   Findings: Cholecystitis with Cholelithiasis  Estimated Blood Loss: less than 50 mL         Drains: none          Total IV Fluids: 1000 mL         Specimens: Gallbladder           Complications: None; patient tolerated the procedure well.         Disposition: PACU - hemodynamically stable.         Condition: stable

## 2014-11-28 NOTE — Anesthesia Procedure Notes (Signed)
Procedure Name: Intubation Date/Time: 11/28/2014 4:29 PM Performed by: Little IshikawaMERCER, Parmvir Boomer L Pre-anesthesia Checklist: Patient identified, Timeout performed, Emergency Drugs available, Suction available and Patient being monitored Patient Re-evaluated:Patient Re-evaluated prior to inductionOxygen Delivery Method: Circle system utilized Preoxygenation: Pre-oxygenation with 100% oxygen Intubation Type: IV induction Ventilation: Mask ventilation without difficulty Laryngoscope Size: Mac and 3 Grade View: Grade I Tube size: 7.0 mm Number of attempts: 1 Airway Equipment and Method: Stylet Placement Confirmation: ETT inserted through vocal cords under direct vision,  positive ETCO2 and breath sounds checked- equal and bilateral Secured at: 22 cm Tube secured with: Tape Dental Injury: Teeth and Oropharynx as per pre-operative assessment

## 2014-11-28 NOTE — Progress Notes (Signed)
  Subjective: Itching from dilaudid she thinks.  Pain comes and goes, not really that uncomfortable now.  No nausea or vomiting here.  Objective: Vital signs in last 24 hours: Temp:  [97.5 F (36.4 C)-99 F (37.2 C)] 98.7 F (37.1 C) (11/21 40980613) Pulse Rate:  [66-89] 66 (11/21 0613) Resp:  [16-20] 19 (11/21 0613) BP: (125-203)/(65-116) 125/65 mmHg (11/21 0613) SpO2:  [97 %-100 %] 97 % (11/21 11910613) Weight:  [121.8 kg (268 lb 8.3 oz)] 121.8 kg (268 lb 8.3 oz) (11/21 0215) Last BM Date: 11/27/14 Diet:  NPO Afebrile, VSS No labs this AM  US 11/28/14:  Cholelithiasis with stones in the gallbladder neck. Diffuse gallbladder wall thickening with pericholecystic edema. Changes are nonspecific but consistent with acute cholecystitis  Intake/Output from previous day: 11/20 0701 - 11/21 0700 In: 240 [I.V.:240] Out: 500 [Urine:500] Intake/Output this shift:    General appearance: alert, cooperative and no distress GI: soft, still tender RUQ, some pain that comes and goes..  Lab Results:   Recent Labs  11/27/14 2333  WBC 13.3*  HGB 13.5  HCT 41.4  PLT 292    BMET  Recent Labs  11/27/14 2333  NA 137  K 4.0  CL 104  CO2 24  GLUCOSE 146*  BUN 14  CREATININE 0.82  CALCIUM 8.8*   PT/INR No results for input(s): LABPROT, INR in the last 72 hours.   Recent Labs Lab 11/27/14 2333  AST 28  ALT 31  ALKPHOS 67  BILITOT 0.3  PROT 7.0  ALBUMIN 3.8     Lipase     Component Value Date/Time   LIPASE 41 11/27/2014 2333     Studies/Results: [place] Abdomen Limited Ruq  11/28/2014  CLINICAL DATA:  Right upper quadrant pain for 4 hours. EXAM: US ABDOMEN LIMITED - RIGHT UPPER QUADRANT COMPARISON:  None. FINDINGS: Gallbladder: Cholelithiasis with stones demonstrated in the gallbladder neck. Largest stone is measured at 2.2 cm diameter. Stones are nonmobile. There is diffuse gallbladder wall thickening with pericholecystic edema. Murphy's sign is negative. Common bile duct:  Diameter: 3.9 mm, normal Liver: No focal lesion identified. Within normal limits in parenchymal echogenicity. IMPRESSION: Cholelithiasis with stones in the gallbladder neck. Diffuse gallbladder wall thickening with pericholecystic edema. Changes are nonspecific but consistent with acute cholecystitis in the appropriate clinical setting. Electronically Signed   By: Burman NievesWilliam  Stevens M.D.   On: 11/28/2014 00:18    Medications: . cefTRIAXone (ROCEPHIN)  IV  2 g Intravenous Q24H   . sodium chloride 100 mL/hr at 11/28/14 0336   Prior to Admission medications   Medication Sig Start Date End Date Taking? Authorizing Provider  escitalopram (LEXAPRO) 10 MG tablet Take 1 tablet once daily Patient not taking: Reported on 11/28/2014 05/02/14   Sandford CrazeMelissa O'Sullivan, NP     Assessment/Plan Cholecystitis Hx of depression Hx of diverticulitis Hx of arthritis   Antibiotics:  Rocephin day 2 DVT: SCD   Plan:  We are going to try and get her surgery done later today. Continue antibiotics, NPO till we know for sure, if she cannot get done today, we may try her on some clears later today, and reschedule for tomorrow.  LOS: 0 days    Xaiden Fleig 11/28/2014

## 2014-11-28 NOTE — ED Notes (Signed)
Pt revealed she is having chest pain. EKG printed and shown to Dr. Wilkie AyeHorton.

## 2014-11-28 NOTE — Progress Notes (Signed)
Utilization review complete. Samit Sylve RN CCM Case Mgmt phone [phone] 

## 2014-11-28 NOTE — Anesthesia Preprocedure Evaluation (Addendum)
Anesthesia Evaluation  Patient identified by MRN, date of birth, ID band Patient awake    Reviewed: Allergy & Precautions, NPO status , Patient's Chart, lab work & pertinent test results  Airway Mallampati: II  TM Distance: >3 FB Neck ROM: Full    Dental   Pulmonary Current Smoker,    breath sounds clear to auscultation       Cardiovascular negative cardio ROS   Rhythm:Regular Rate:Normal     Neuro/Psych    GI/Hepatic negative GI ROS, Neg liver ROS,   Endo/Other    Renal/GU negative Renal ROS     Musculoskeletal   Abdominal   Peds  Hematology negative hematology ROS (+)   Anesthesia Other Findings   Reproductive/Obstetrics                            Anesthesia Physical Anesthesia Plan  ASA: III  Anesthesia Plan: General   Post-op Pain Management:    Induction: Intravenous  Airway Management Planned: Oral ETT  Additional Equipment:   Intra-op Plan:   Post-operative Plan: Extubation in OR  Informed Consent: I have reviewed the patients History and Physical, chart, labs and discussed the procedure including the risks, benefits and alternatives for the proposed anesthesia with the patient or authorized representative who has indicated his/her understanding and acceptance.   Dental advisory given  Plan Discussed with: CRNA and Anesthesiologist  Anesthesia Plan Comments:         Anesthesia Quick Evaluation

## 2014-11-28 NOTE — Anesthesia Postprocedure Evaluation (Signed)
Anesthesia Post Note  Patient: Jean LoanAnnalisa Murillo  Procedure(s) Performed: Procedure(s): LAPAROSCOPIC CHOLECYSTECTOMY  Patient location during evaluation: PACU Anesthesia Type: General Level of consciousness: awake Pain management: pain level controlled Vital Signs Assessment: post-procedure vital signs reviewed and stable Respiratory status: spontaneous breathing Cardiovascular status: stable Anesthetic complications: no    Last Vitals:  Filed Vitals:   11/28/14 1745 11/28/14 1800  BP: 138/94 132/85  Pulse: 73 58  Temp:    Resp: 17 16    Last Pain:  Filed Vitals:   11/28/14 1807  PainSc: 5                  EDWARDS,Rise Traeger

## 2014-11-28 NOTE — Interval H&P Note (Signed)
History and Physical Interval Note:  11/28/2014 3:36 PM  Jean Bolton  has presented today for surgery, with the diagnosis of Gallstones  The various methods of treatment have been discussed with the patient and family. After consideration of risks, benefits and other options for treatment, the patient has consented to  Procedure(s): LAPAROSCOPIC CHOLECYSTECTOMY WITH INTRAOPERATIVE CHOLANGIOGRAM (N/A) as a surgical intervention .  The patient's history has been reviewed, patient examined, no change in status, stable for surgery.  I have reviewed the patient's chart and labs.  Questions were answered to the patient's satisfaction.     Mahek Schlesinger A.

## 2014-11-28 NOTE — Transfer of Care (Signed)
Immediate Anesthesia Transfer of Care Note  Patient: Jean LoanAnnalisa Wynes  Procedure(s) Performed: Procedure(s): LAPAROSCOPIC CHOLECYSTECTOMY  Patient Location: PACU  Anesthesia Type:General  Level of Consciousness: awake  Airway & Oxygen Therapy: Patient Spontanous Breathing and Patient connected to nasal cannula oxygen  Post-op Assessment: Report given to RN and Post -op Vital signs reviewed and stable  Post vital signs: stable  Last Vitals:  Filed Vitals:   11/28/14 1006 11/28/14 1419  BP: 124/76 137/78  Pulse: 69 75  Temp: 36.7 C 37.2 C  Resp: 19 18    Complications: No apparent anesthesia complications

## 2014-11-28 NOTE — H&P (View-Only) (Signed)
  Subjective: Itching from dilaudid she thinks.  Pain comes and goes, not really that uncomfortable now.  No nausea or vomiting here.  Objective: Vital signs in last 24 hours: Temp:  [97.5 F (36.4 C)-99 F (37.2 C)] 98.7 F (37.1 C) (11/21 0613) Pulse Rate:  [66-89] 66 (11/21 0613) Resp:  [16-20] 19 (11/21 0613) BP: (125-203)/(65-116) 125/65 mmHg (11/21 0613) SpO2:  [97 %-100 %] 97 % (11/21 0613) Weight:  [121.8 kg (268 lb 8.3 oz)] 121.8 kg (268 lb 8.3 oz) (11/21 0215) Last BM Date: 11/27/14 Diet:  NPO Afebrile, VSS No labs this AM  US 11/28/14:  Cholelithiasis with stones in the gallbladder neck. Diffuse gallbladder wall thickening with pericholecystic edema. Changes are nonspecific but consistent with acute cholecystitis  Intake/Output from previous day: 11/20 0701 - 11/21 0700 In: 240 [I.V.:240] Out: 500 [Urine:500] Intake/Output this shift:    General appearance: alert, cooperative and no distress GI: soft, still tender RUQ, some pain that comes and goes..  Lab Results:   Recent Labs  11/27/14 2333  WBC 13.3*  HGB 13.5  HCT 41.4  PLT 292    BMET  Recent Labs  11/27/14 2333  NA 137  K 4.0  CL 104  CO2 24  GLUCOSE 146*  BUN 14  CREATININE 0.82  CALCIUM 8.8*   PT/INR No results for input(s): LABPROT, INR in the last 72 hours.   Recent Labs Lab 11/27/14 2333  AST 28  ALT 31  ALKPHOS 67  BILITOT 0.3  PROT 7.0  ALBUMIN 3.8     Lipase     Component Value Date/Time   LIPASE 41 11/27/2014 2333     Studies/Results: Us Abdomen Limited Ruq  11/28/2014  CLINICAL DATA:  Right upper quadrant pain for 4 hours. EXAM: US ABDOMEN LIMITED - RIGHT UPPER QUADRANT COMPARISON:  None. FINDINGS: Gallbladder: Cholelithiasis with stones demonstrated in the gallbladder neck. Largest stone is measured at 2.2 cm diameter. Stones are nonmobile. There is diffuse gallbladder wall thickening with pericholecystic edema. Murphy's sign is negative. Common bile duct:  Diameter: 3.9 mm, normal Liver: No focal lesion identified. Within normal limits in parenchymal echogenicity. IMPRESSION: Cholelithiasis with stones in the gallbladder neck. Diffuse gallbladder wall thickening with pericholecystic edema. Changes are nonspecific but consistent with acute cholecystitis in the appropriate clinical setting. Electronically Signed   By: William  Stevens M.D.   On: 11/28/2014 00:18    Medications: . cefTRIAXone (ROCEPHIN)  IV  2 g Intravenous Q24H   . sodium chloride 100 mL/hr at 11/28/14 0336   Prior to Admission medications   Medication Sig Start Date End Date Taking? Authorizing Provider  escitalopram (LEXAPRO) 10 MG tablet Take 1 tablet once daily Patient not taking: Reported on 11/28/2014 05/02/14   Melissa O'Sullivan, NP     Assessment/Plan Cholecystitis Hx of depression Hx of diverticulitis Hx of arthritis   Antibiotics:  Rocephin day 2 DVT: SCD   Plan:  We are going to try and get her surgery done later today. Continue antibiotics, NPO till we know for sure, if she cannot get done today, we may try her on some clears later today, and reschedule for tomorrow.  LOS: 0 days    Jean Bolton 11/28/2014   

## 2014-11-28 NOTE — ED Notes (Signed)
Report attempted 

## 2014-11-28 NOTE — H&P (Signed)
Jean Bolton is an 44 y.o. female.   Chief Complaint: ruq pain HPI: 53 yof with known gallstones who presents with ruq pain that is not going away.  No fevers.  Was recommended lap chole two years ago but didn't do it.  Past Medical History  Diagnosis Date  . Depression   . Arthritis   . Diverticulitis 2012    History reviewed. No pertinent past surgical history.  Family History  Problem Relation Age of Onset  . Diabetes Mother   . Hypertension Mother   . Arthritis Mother   . Hypertension Father   . Arthritis Father   . Alcohol abuse Father   . Heart attack Neg Hx   . Sudden death Neg Hx   . Hypertension Maternal Grandmother   . Hypertension Maternal Grandfather   . Arthritis Maternal Grandfather    Social History:  reports that she has been smoking.  She has never used smokeless tobacco. She reports that she drinks about 1.5 - 3.0 oz of alcohol per week. She reports that she uses illicit drugs (Marijuana).  Allergies: No Known Allergies    Medications Prior to Admission  Medication Sig Dispense Refill  . escitalopram (LEXAPRO) 10 MG tablet Take 1 tablet once daily (Patient not taking: Reported on 11/28/2014) 90 tablet 1    Results for orders placed or performed during the hospital encounter of 11/27/14 (from the past 48 hour(s))  I-Stat beta hCG blood, ED ([facility], [facility], [facility] only)     Status: None   Collection Time: 11/27/14 11:27 PM  Result Value Ref Range   I-stat hCG, quantitative <5.0 <5 mIU/mL   Comment 3            Comment:   GEST. AGE      CONC.  (mIU/mL)   <=1 WEEK        5 - 50     2 WEEKS       50 - 500     3 WEEKS       100 - 10,000     4 WEEKS     1,000 - 30,000        FEMALE AND NON-PREGNANT FEMALE:     LESS THAN 5 mIU/mL   Lipase, blood     Status: None   Collection Time: 11/27/14 11:33 PM  Result Value Ref Range   Lipase 41 11 - 51 U/L  Comprehensive metabolic panel     Status: Abnormal   Collection Time: 11/27/14 11:33 PM  Result Value Ref Range    Sodium 137 135 - 145 mmol/L   Potassium 4.0 3.5 - 5.1 mmol/L   Chloride 104 101 - 111 mmol/L   CO2 24 22 - 32 mmol/L   Glucose, Bld 146 (H) 65 - 99 mg/dL   BUN 14 6 - 20 mg/dL   Creatinine, Ser 0.82 0.44 - 1.00 mg/dL   Calcium 8.8 (L) 8.9 - 10.3 mg/dL   Total Protein 7.0 6.5 - 8.1 g/dL   Albumin 3.8 3.5 - 5.0 g/dL   AST 28 15 - 41 U/L   ALT 31 14 - 54 U/L   Alkaline Phosphatase 67 38 - 126 U/L   Total Bilirubin 0.3 0.3 - 1.2 mg/dL   GFR calc non Af Amer >60 >60 mL/min   GFR calc Af Amer >60 >60 mL/min    Comment: (NOTE) The eGFR has been calculated using the CKD EPI equation. This calculation has not been validated in all clinical situations. eGFR's  persistently <60 mL/min signify possible Chronic Kidney Disease.    Anion gap 9 5 - 15  CBC     Status: Abnormal   Collection Time: 11/27/14 11:33 PM  Result Value Ref Range   WBC 13.3 (H) 4.0 - 10.5 K/uL   RBC 4.45 3.87 - 5.11 MIL/uL   Hemoglobin 13.5 12.0 - 15.0 g/dL   HCT 41.4 36.0 - 46.0 %   MCV 93.0 78.0 - 100.0 fL   MCH 30.3 26.0 - 34.0 pg   MCHC 32.6 30.0 - 36.0 g/dL   RDW 13.7 11.5 - 15.5 %   Platelets 292 150 - 400 K/uL  Urinalysis, Routine w reflex microscopic (not at [facility])     Status: Abnormal   Collection Time: 11/28/14  1:24 AM  Result Value Ref Range   Color, Urine YELLOW YELLOW   APPearance CLOUDY (A) CLEAR   Specific Gravity, Urine 1.013 1.005 - 1.030   pH 7.5 5.0 - 8.0   Glucose, UA 100 (A) NEGATIVE mg/dL   Hgb urine dipstick NEGATIVE NEGATIVE   Bilirubin Urine NEGATIVE NEGATIVE   Ketones, ur NEGATIVE NEGATIVE mg/dL   Protein, ur NEGATIVE NEGATIVE mg/dL   Nitrite NEGATIVE NEGATIVE   Leukocytes, UA NEGATIVE NEGATIVE    Comment: MICROSCOPIC NOT DONE ON URINES WITH NEGATIVE PROTEIN, BLOOD, LEUKOCYTES, NITRITE, OR GLUCOSE <1000 mg/dL.  Pregnancy, urine     Status: None   Collection Time: 11/28/14  1:25 AM  Result Value Ref Range   Preg Test, Ur NEGATIVE NEGATIVE    Comment:        THE SENSITIVITY OF  THIS METHODOLOGY IS >20 mIU/mL.    US Abdomen Limited Ruq  11/28/2014  CLINICAL DATA:  Right upper quadrant pain for 4 hours. EXAM: US ABDOMEN LIMITED - RIGHT UPPER QUADRANT COMPARISON:  None. FINDINGS: Gallbladder: Cholelithiasis with stones demonstrated in the gallbladder neck. Largest stone is measured at 2.2 cm diameter. Stones are nonmobile. There is diffuse gallbladder wall thickening with pericholecystic edema. Murphy's sign is negative. Common bile duct: Diameter: 3.9 mm, normal Liver: No focal lesion identified. Within normal limits in parenchymal echogenicity. IMPRESSION: Cholelithiasis with stones in the gallbladder neck. Diffuse gallbladder wall thickening with pericholecystic edema. Changes are nonspecific but consistent with acute cholecystitis in the appropriate clinical setting. Electronically Signed   By: Lucienne Capers M.D.   On: 11/28/2014 00:18    Review of Systems  Constitutional: Negative for fever and chills.  Respiratory: Negative for shortness of breath.   Cardiovascular: Negative for chest pain.  Gastrointestinal: Positive for nausea and abdominal pain. Negative for vomiting.    Blood pressure 125/65, pulse 66, temperature 98.7 F (37.1 C), temperature source Oral, resp. rate 19, height _0  (1.626 m), weight 121.8 kg (268 lb 8.3 oz), last menstrual period 11/13/2014, SpO2 97 %. Physical Exam  Vitals reviewed. Constitutional: She appears well-developed and well-nourished.  Eyes: No scleral icterus.  Cardiovascular: Normal rate, regular rhythm and normal heart sounds.   Respiratory: Effort normal and breath sounds normal.  GI: Soft. She exhibits no distension. There is tenderness in the right upper quadrant.     Assessment/Plan Cholecystitis  Plan lap chole   Bellamia Ferch 11/28/2014, 6:52 AM

## 2014-11-29 ENCOUNTER — Encounter (HOSPITAL_COMMUNITY): Payer: Self-pay | Admitting: Surgery

## 2014-11-29 LAB — COMPREHENSIVE METABOLIC PANEL
ALBUMIN: 2.8 g/dL — AB (ref 3.5–5.0)
ALT: 53 U/L (ref 14–54)
AST: 57 U/L — AB (ref 15–41)
Alkaline Phosphatase: 56 U/L (ref 38–126)
Anion gap: 6 (ref 5–15)
BILIRUBIN TOTAL: 0.5 mg/dL (ref 0.3–1.2)
BUN: 5 mg/dL — AB (ref 6–20)
CO2: 24 mmol/L (ref 22–32)
Calcium: 8.3 mg/dL — ABNORMAL LOW (ref 8.9–10.3)
Chloride: 106 mmol/L (ref 101–111)
Creatinine, Ser: 0.68 mg/dL (ref 0.44–1.00)
GFR calc Af Amer: >60 mL/min (ref >60)
GFR calc non Af Amer: >60 mL/min (ref >60)
GLUCOSE: 90 mg/dL (ref 65–99)
POTASSIUM: 3.9 mmol/L (ref 3.5–5.1)
Sodium: 136 mmol/L (ref 135–145)
Total Protein: 5.7 g/dL — ABNORMAL LOW (ref 6.5–8.1)

## 2014-11-29 LAB — CBC
HEMATOCRIT: 37.5 % (ref 36.0–46.0)
HEMOGLOBIN: 12 g/dL (ref 12.0–15.0)
MCH: 30.1 pg (ref 26.0–34.0)
MCHC: 32 g/dL (ref 30.0–36.0)
MCV: 94 fL (ref 78.0–100.0)
Platelets: 248 10*3/uL (ref 150–400)
RBC: 3.99 MIL/uL (ref 3.87–5.11)
RDW: 13.9 % (ref 11.5–15.5)
WBC: 11.7 10*3/uL — AB (ref 4.0–10.5)

## 2014-11-29 LAB — LIPASE, BLOOD: LIPASE: 21 U/L (ref 11–51)

## 2014-11-29 MED ORDER — OXYCODONE-ACETAMINOPHEN 5-325 MG PO TABS: 1.0000 | ORAL_TABLET | ORAL | Status: AC | PRN

## 2014-11-29 MED ORDER — IBUPROFEN 600 MG PO TABS
600.0000 mg | ORAL_TABLET | Freq: Four times a day (QID) | ORAL | Status: DC | PRN
  Administered 2014-11-29: 600 mg via ORAL
  Filled 2014-11-29: qty 1

## 2014-11-29 MED ORDER — OXYCODONE-ACETAMINOPHEN 5-325 MG PO TABS
1.0000 | ORAL_TABLET | ORAL | Status: DC | PRN
  Administered 2014-11-29: 2 via ORAL
  Filled 2014-11-29: qty 2

## 2014-11-29 MED ORDER — IBUPROFEN 200 MG PO TABS: ORAL_TABLET | ORAL | Status: AC

## 2014-11-29 NOTE — Discharge Instructions (Signed)
[facility] ______[facility] °LAPAROSCOPIC SURGERY: POST OP INSTRUCTIONS °Always review your discharge instruction sheet given to you by the facility where your surgery was performed. °IF YOU HAVE DISABILITY OR FAMILY LEAVE FORMS, YOU MUST BRING THEM TO THE OFFICE FOR PROCESSING.   °DO NOT GIVE THEM TO YOUR DOCTOR. ° °1. A prescription for pain medication may be given to you upon discharge.  Take your pain medication as prescribed, if needed.  If narcotic pain medicine is not needed, then you may take acetaminophen (Tylenol) or ibuprofen (Advil) as needed. °2. Take your usually prescribed medications unless otherwise directed. °3. If you need a refill on your pain medication, please contact your pharmacy.  They will contact our office to request authorization. Prescriptions will not be filled after 5pm or on week-ends. °4. You should follow a light diet the first few days after arrival home, such as soup and crackers, etc.  Be sure to include lots of fluids daily. °5. Most patients will experience some swelling and bruising in the area of the incisions.  Ice packs will help.  Swelling and bruising can take several days to resolve.  °6. It is common to experience some constipation if taking pain medication after surgery.  Increasing fluid intake and taking a stool softener (such as Colace) will usually help or prevent this problem from occurring.  A mild laxative (Milk of Magnesia or Miralax) should be taken according to package instructions if there are no bowel movements after 48 hours. °7. Unless discharge instructions indicate otherwise, you may remove your bandages 24-48 hours after surgery, and you may shower at that time.  You may have steri-strips (small skin tapes) in place directly over the incision.  These strips should be left on the skin for 7-10 days.  If your surgeon used skin glue on the incision, you may shower in 24 hours.  The glue will flake off over the next 2-3 weeks.  Any sutures or  staples will be removed at the office during your follow-up visit. °8. ACTIVITIES:  You may resume regular (light) daily activities beginning the next day--such as daily self-care, walking, climbing stairs--gradually increasing activities as tolerated.  You may have sexual intercourse when it is comfortable.  Refrain from any heavy lifting or straining until approved by your doctor. °a. You may drive when you are no longer taking prescription pain medication, you can comfortably wear a seatbelt, and you can safely maneuver your car and apply brakes. °b. RETURN TO WORK:  __________________________________________________________ °9. You should see your doctor in the office for a follow-up appointment approximately 2-3 weeks after your surgery.  Make sure that you call for this appointment within a day or two after you arrive home to insure a convenient appointment time. °10. OTHER INSTRUCTIONS: __________________________________________________________________________________________________________________________ __________________________________________________________________________________________________________________________ °WHEN TO CALL YOUR DOCTOR: °1. Fever over 101.0 °2. Inability to urinate °3. Continued bleeding from incision. °4. Increased pain, redness, or drainage from the incision. °5. Increasing abdominal pain ° °The clinic staff is available to answer your questions during regular business hours.  Please don’t hesitate to call and ask to speak to one of the nurses for clinical concerns.  If you have a medical emergency, go to the nearest emergency room or call 911.  A surgeon from [facility] is always on call at the hospital. °[address], [address], [place], [place]  [address] ? [address] [address], [place], [place]   [address] °[phone] ? [phone] ? FAX [phone] °Web site:   [email or web address] ° °Laparoscopic Cholecystectomy, Care After °Refer to this  sheet in the next few weeks. These instructions provide you with information about caring for yourself after your procedure. Your health care provider may also give you more specific instructions. Your treatment has been planned according to current medical practices, but problems sometimes occur. Call your health care provider if you have any problems or questions after your procedure. °WHAT TO EXPECT AFTER THE PROCEDURE °After your procedure, it is common to have: °· Pain at your incision sites. You will be given pain medicines to control your pain. °· Mild nausea or vomiting. This should improve after the first 24 hours. °· Bloating and possible shoulder pain from the gas that was used during the procedure. This will improve after the first 24 hours. °HOME CARE INSTRUCTIONS °Incision Care °· Follow instructions from your health care provider about how to take care of your incisions. Make sure you: °¨ Wash your hands with soap and water before you change your bandage (dressing). If soap and water are not available, use hand sanitizer. °¨ Change your dressing as told by your health care provider. °¨ Leave stitches (sutures), skin glue, or adhesive strips in place. These skin closures may need to be in place for 2 weeks or longer. If adhesive strip edges start to loosen and curl up, you may trim the loose edges. Do not remove adhesive strips completely unless your health care provider tells you to do that. °· Do not take baths, swim, or use a hot tub until your health care provider approves. Ask your health care provider if you can take showers. You may only be allowed to take sponge baths for bathing. °General Instructions °· Take over-the-counter and prescription medicines only as told by your health care provider. °· Do not drive or operate heavy machinery while taking prescription pain medicine. °· Return to your normal diet as told by your health care provider. °· Do not lift anything that is heavier than 10 lb  (4.5 kg). °· Do not play contact sports for one week or until your health care provider approves. °SEEK MEDICAL CARE IF:  °· You have redness, swelling, or pain at the site of your incision. °· You have fluid, blood, or pus coming from your incision. °· You notice a bad smell coming from your incision area. °· Your surgical incisions break open. °· You have a fever. °SEEK IMMEDIATE MEDICAL CARE IF: °· You develop a rash. °· You have difficulty breathing. °· You have chest pain. °· You have increasing pain in your shoulders (shoulder strap areas). °· You faint or have dizzy episodes while you are standing. °· You have severe pain in your abdomen. °· You have nausea or vomiting that lasts for more than one day. °  °This information is not intended to replace advice given to you by your health care provider. Make sure you discuss any questions you have with your health care provider. °  °Document Released: 12/24/2004 Document Revised: 09/14/2014 Document Reviewed: 08/05/2012 °Elsevier Interactive Patient Education ©2016 [employer] ° °

## 2014-11-29 NOTE — Progress Notes (Signed)
D/c instructions reviewed with pt, copy of instructions given to pt. Pt d/c'd with her belongings, pt declined wheelchair, walked out with family, (steady gait, walked unit numerous times today without problem). Pt given script for her pain medication per MD.

## 2014-11-29 NOTE — Progress Notes (Signed)
1 Day Post-Op  Subjective: She is pretty sore, and afraid she will come apart.  I will increase pain meds and mobilize more.  Objective: Vital signs in last 24 hours: Temp:  [98.1 F (36.7 C)-99.5 F (37.5 C)] 99.5 F (37.5 C) (11/22 0500) Pulse Rate:  [58-75] 74 (11/22 0500) Resp:  [15-28] 18 (11/22 0500) BP: (102-155)/(65-94) 102/66 mmHg (11/22 0500) SpO2:  [91 %-100 %] 91 % (11/22 0500) Last BM Date: 11/27/14 300 urine recorded Afebrile, VSS Labs OK Intake/Output from previous day: 11/21 0701 - 11/22 0700 In: 1200 [I.V.:1200] Out: 325 [Urine:300; Blood:25] Intake/Output this shift:    General appearance: alert, cooperative and no distress GI: soft, very sore , some brusing around the port sites, but otherwise looks fine.  Lab Results:   Recent Labs  11/27/14 2333 11/29/14 0310  WBC 13.3* 11.7*  HGB 13.5 12.0  HCT 41.4 37.5  PLT 292 248    BMET  Recent Labs  11/27/14 2333 11/29/14 0310  NA 137 136  K 4.0 3.9  CL 104 106  CO2 24 24  GLUCOSE 146* 90  BUN 14 5*  CREATININE 0.82 0.68  CALCIUM 8.8* 8.3*   PT/INR No results for input(s): LABPROT, INR in the last 72 hours.   Recent Labs Lab 11/27/14 2333 11/29/14 0310  AST 28 57*  ALT 31 53  ALKPHOS 67 56  BILITOT 0.3 0.5  PROT 7.0 5.7*  ALBUMIN 3.8 2.8*     Lipase     Component Value Date/Time   LIPASE 21 11/29/2014 0310     Studies/Results: [place] Abdomen Limited Ruq  11/28/2014  CLINICAL DATA:  Right upper quadrant pain for 4 hours. EXAM: US ABDOMEN LIMITED - RIGHT UPPER QUADRANT COMPARISON:  None. FINDINGS: Gallbladder: Cholelithiasis with stones demonstrated in the gallbladder neck. Largest stone is measured at 2.2 cm diameter. Stones are nonmobile. There is diffuse gallbladder wall thickening with pericholecystic edema. Murphy's sign is negative. Common bile duct: Diameter: 3.9 mm, normal Liver: No focal lesion identified. Within normal limits in parenchymal echogenicity. IMPRESSION:  Cholelithiasis with stones in the gallbladder neck. Diffuse gallbladder wall thickening with pericholecystic edema. Changes are nonspecific but consistent with acute cholecystitis in the appropriate clinical setting. Electronically Signed   By: Burman NievesWilliam  Stevens M.D.   On: 11/28/2014 00:18    Medications: . cefTRIAXone (ROCEPHIN)  IV  2 g Intravenous Q24H  . enoxaparin (LOVENOX) injection  40 mg Subcutaneous Q24H    Assessment/Plan Calculus of gallbladder with acute cholecystitis, without mention of obstruction Laparoscopic Cholecystectomy with IOC, 11/28/14, Dr. Luisa Hartornett Hx of depression Hx of diverticulitis Hx of arthritis  Antibiotics: Rocephin day 3 DVT: SCD/lovenox    Plan:  Add ibuprofen, mobilize and home later today.     LOS: 1 day    Jean Bolton 11/29/2014

## 2014-12-01 ENCOUNTER — Emergency Department (HOSPITAL_BASED_OUTPATIENT_CLINIC_OR_DEPARTMENT_OTHER)
Admission: EM | Admit: 2014-12-01 | Discharge: 2014-12-01 | Disposition: A | Discharge status: Home / Self Care | Payer: PRIVATE HEALTH INSURANCE | Attending: Emergency Medicine | Admitting: Emergency Medicine

## 2014-12-01 ENCOUNTER — Encounter (HOSPITAL_BASED_OUTPATIENT_CLINIC_OR_DEPARTMENT_OTHER): Payer: Self-pay | Admitting: *Deleted

## 2014-12-01 DIAGNOSIS — L03311 Cellulitis of abdominal wall: Secondary | ICD-10-CM | POA: Insufficient documentation

## 2014-12-01 DIAGNOSIS — Z8719 Personal history of other diseases of the digestive system: Secondary | ICD-10-CM | POA: Insufficient documentation

## 2014-12-01 DIAGNOSIS — F329 Major depressive disorder, single episode, unspecified: Secondary | ICD-10-CM | POA: Insufficient documentation

## 2014-12-01 DIAGNOSIS — T814XXA Infection following a procedure, initial encounter: Secondary | ICD-10-CM | POA: Insufficient documentation

## 2014-12-01 DIAGNOSIS — F17200 Nicotine dependence, unspecified, uncomplicated: Secondary | ICD-10-CM | POA: Insufficient documentation

## 2014-12-01 DIAGNOSIS — Z9049 Acquired absence of other specified parts of digestive tract: Secondary | ICD-10-CM | POA: Insufficient documentation

## 2014-12-01 DIAGNOSIS — Y658 Other specified misadventures during surgical and medical care: Secondary | ICD-10-CM | POA: Insufficient documentation

## 2014-12-01 DIAGNOSIS — M1990 Unspecified osteoarthritis, unspecified site: Secondary | ICD-10-CM | POA: Insufficient documentation

## 2014-12-01 MED ORDER — MORPHINE SULFATE (PF) 10 MG/ML IV SOLN
10.0000 mg | Freq: Once | INTRAVENOUS | Status: AC
Start: 2014-12-01 — End: 2014-12-01
  Administered 2014-12-01: 10 mg via INTRAMUSCULAR
  Filled 2014-12-01: qty 1

## 2014-12-01 MED ORDER — CLINDAMYCIN HCL 150 MG PO CAPS
300.0000 mg | ORAL_CAPSULE | Freq: Once | ORAL | Status: AC
  Administered 2014-12-01: 300 mg via ORAL
  Filled 2014-12-01: qty 2

## 2014-12-01 MED ORDER — CLINDAMYCIN HCL 300 MG PO CAPS: 300.0000 mg | ORAL_CAPSULE | Freq: Four times a day (QID) | ORAL | Status: AC

## 2014-12-01 NOTE — ED Notes (Signed)
Gallbladder removal on Monday. Fever and tenderness around her umbilicus today.

## 2014-12-01 NOTE — ED Notes (Signed)
MD at bedside. 

## 2014-12-01 NOTE — ED Provider Notes (Signed)
CSN: [id number]     Arrival date & time 12/01/14  1931 History   By signing my name below, I, Arianna Nassar, attest that this documentation has been prepared under the direction and in the presence of Marily MemosJason Sheccid Lahmann, MD. Electronically Signed: Octavia HeirArianna Nassar, ED Scribe. 12/01/2014. 8:06 PM.    Chief Complaint  Patient presents with  . Post-op Problem      The history is provided by the patient. No language interpreter was used.   HPI Comments: Darlis Loannnalisa Shambley is a 44 y.o. female who presents to the Emergency Department complaining of a sudden onset, gradual worsening post-op problem onset today. Pt states she had a cholecystectomy on 11/21 and she reports the area feels like a different kind of pain. She has fever and redness around her umbilicus. Pt notes pain with movement. Pt denies nausea and vomiting. She has no known allergies.  Past Medical History  Diagnosis Date  . Depression   . Arthritis   . Diverticulitis 2012   Past Surgical History  Procedure Laterality Date  . Cholecystectomy  11/28/2014    Procedure: LAPAROSCOPIC CHOLECYSTECTOMY;  Surgeon: Harriette Bouillonhomas Cornett, MD;  [facility];  Service: General;;   Family History  Problem Relation Age of Onset  . Diabetes Mother   . Hypertension Mother   . Arthritis Mother   . Hypertension Father   . Arthritis Father   . Alcohol abuse Father   . Heart attack Neg Hx   . Sudden death Neg Hx   . Hypertension Maternal Grandmother   . Hypertension Maternal Grandfather   . Arthritis Maternal Grandfather    Social History  Substance Use Topics  . Smoking status: Current Some Day Smoker  . Smokeless tobacco: Never Used     Comment: Pt is currently vaping.  . Alcohol Use: 1.5 - 3.0 oz/week    3-6 Standard drinks or equivalent per week   OB History    No data available     Review of Systems  Gastrointestinal: Negative for nausea and vomiting.  Skin: Positive for color change.  All other systems reviewed and are  negative.     Allergies  Review of patient's allergies indicates no known allergies.  Home Medications   Prior to Admission medications   Medication Sig Start Date End Date Taking? Authorizing Provider  clindamycin (CLEOCIN) 300 MG capsule Take 1 capsule (300 mg total) by mouth 4 (four) times daily. 12/01/14   Marily MemosJason Bradi Arbuthnot, MD  escitalopram (LEXAPRO) 10 MG tablet Take 1 tablet once daily Patient not taking: Reported on 11/28/2014 05/02/14   Sandford CrazeMelissa O'Sullivan, NP  ibuprofen (ADVIL,MOTRIN) 200 MG tablet You can take 2-3 tablets every 6 hours as needed for pain.  Use this first then narcotic pain medicine. 11/29/14   Sherrie GeorgeWillard Jennings, PA-C  oxyCODONE-acetaminophen (PERCOCET/ROXICET) 5-325 MG tablet Take 1-2 tablets by mouth every 4 (four) hours as needed for moderate pain. 11/29/14   Sherrie GeorgeWillard Jennings, PA-C   Triage vitals: BP 152/98 mmHg  Pulse 94  Temp(Src) 99 F (37.2 C) (Oral)  Resp 20  Ht 5\' 4"  (1.626 m)  Wt 268 lb (121.564 kg)  BMI 45.98 kg/m2  SpO2 97%  LMP 11/13/2014 (Approximate) Physical Exam  Constitutional: She is oriented to person, place, and time. She appears well-developed and well-nourished. No distress.  HENT:  Head: Normocephalic and atraumatic.  Eyes: EOM are normal.  Neck: Normal range of motion.  Cardiovascular: Normal rate, regular rhythm and normal heart sounds.   Pulmonary/Chest: Effort normal and breath  sounds normal.  Abdominal: Soft. She exhibits no distension and no mass. There is no tenderness.  Rash under umbilicus, some induration around umbilicus, benign, no fluctuance, no drainage, slightly warm  Musculoskeletal: Normal range of motion.  Neurological: She is alert and oriented to person, place, and time.  Skin: Skin is warm and dry.  Psychiatric: She has a normal mood and affect. Judgment normal.  Nursing note and vitals reviewed.       ED Course  Procedures  DIAGNOSTIC STUDIES: Oxygen Saturation is 97% on RA, normal by my  interpretation.  COORDINATION OF CARE:  7:59 PM Discussed treatment plan with pt at bedside and pt agreed to plan.  Labs Review Labs Reviewed - No data to display  Imaging Review No results found. I have personally reviewed and evaluated these images and lab results as part of my medical decision-making.   EKG Interpretation None      MDM   Final diagnoses:  Cellulitis of abdominal wall   Cellulitis around umbilicus from where she had a cholecystectomy recently. I discussed the case on the phone with on-call surgery and they agreed with my plan of starting clindamycin and have her follow-up with her surgeon in the office when they're available. No evidence of sepsis, abscess or other abnormality required admission this time. Has pain medication home and will continue taking those as needed. If not improving in 48 hours or return here for further evaluation. There is no crepitus or other concern for necrotizing infection at this time however I discussed she felt gas or crepitus she should return here immediately.  I personally performed the services described in this documentation, which was scribed in my presence. The recorded information has been reviewed and is accurate.   Marily Memos, MD 12/02/14 [phone]

## 2014-12-02 NOTE — Discharge Summary (Signed)
Physician Discharge Summary  Patient ID: Jean Bolton MRN: [id number] DOB/AGE: [birth year] 44 y.o.  Admit date: 11/27/2014 Discharge date: 12/02/2014  Admission Diagnoses:  Cholecystitis Hx of depression Hx of diverticulitis Hx of arthritis   Discharge Diagnoses:  Cholecystitis Hx of depression Hx of diverticulitis Hx of arthritis   Principal Problem:   Acute cholecystitis s/p lap cholecystectomy 11/28/2014 Active Problems:   Depression   Tobacco abuse   Cholelithiasis   Elevated blood pressure   PROCEDURES:  Laparoscopic cholecystectomy with IOC, 11/28/14, Dr. Harriette Bouillonhomas Cornett  PATHOLOGY: Gallbladder - CHRONIC ACTIVE CHOLECYSTITIS. - CHOLELITHIASIS.  Hospital Course: [age] yof with known gallstones who presents with ruq pain that is not going away. No fevers. Was recommended lap chole two years ago but didn't do it.  She was admitted and taken to the OR later that day.  Post op she was sore but did well and went home later that day.  Follow up below.  Condition on D/C:  Improved       Disposition: 01-Home or Self Care     Medication List    TAKE these medications        escitalopram 10 MG tablet  Commonly known as:  LEXAPRO  Take 1 tablet once daily     ibuprofen 200 MG tablet  Commonly known as:  ADVIL,MOTRIN  You can take 2-3 tablets every 6 hours as needed for pain.  Use this first then narcotic pain medicine.     oxyCODONE-acetaminophen 5-325 MG tablet  Commonly known as:  PERCOCET/ROXICET  Take 1-2 tablets by mouth every 4 (four) hours as needed for moderate pain.           Follow-up Information    Follow up with [facility] On 12/14/2014.   Specialty:  General Surgery   Why:  Your appointment is at 10:45 AM, be at the office 30 minutes early for check in.   Contact information:   [address] [place] [address] [phone]       Signed: Sherrie GeorgeJENNINGS,Michael Walrath 12/02/2014, 11:30 AM

## 2015-03-28 ENCOUNTER — Encounter: Payer: Self-pay | Admitting: Family

## 2015-03-28 MED ORDER — ESCITALOPRAM OXALATE 10 MG PO TABS: ORAL_TABLET | ORAL | Status: AC

## 2016-02-12 IMAGING — US US ABDOMEN LIMITED
1 series · 14 of 25 positions shown · non-contrast
Comparison: None.

CLINICAL DATA: Right upper quadrant pain for 4 hours.

EXAM:
US ABDOMEN LIMITED - RIGHT UPPER QUADRANT

[Series 1: us abdomen limited · 0.27mm/px · 14 of 33 slices shown]
[im 1/33]
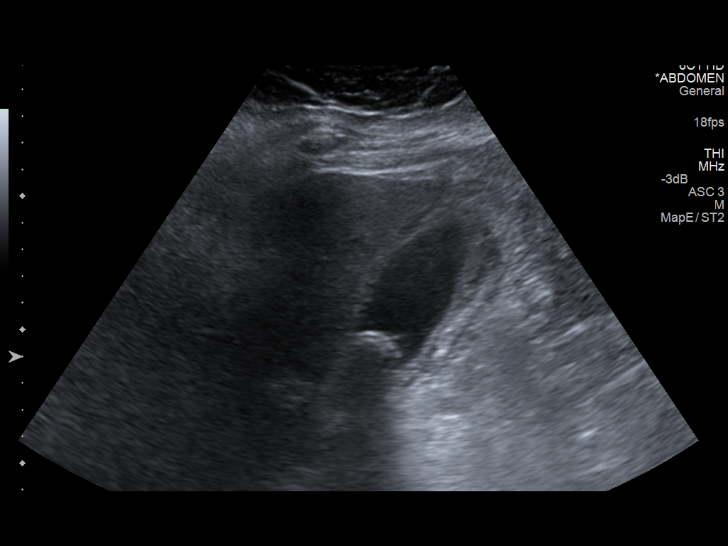
[im 3/33]
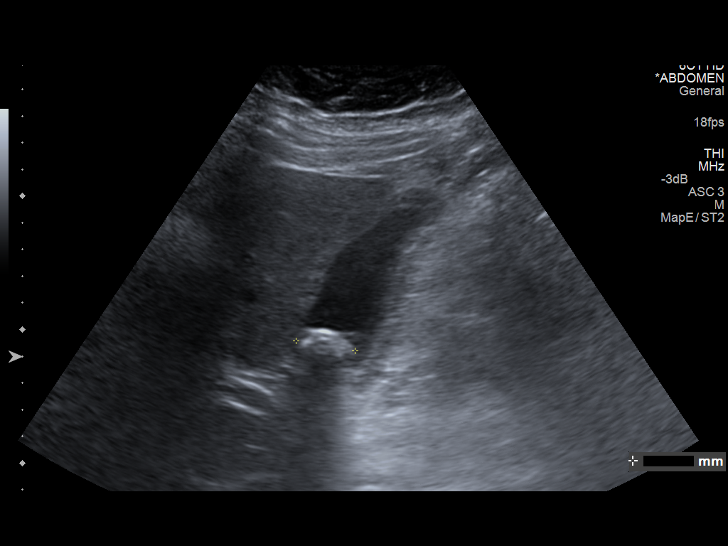
[im 6/33]
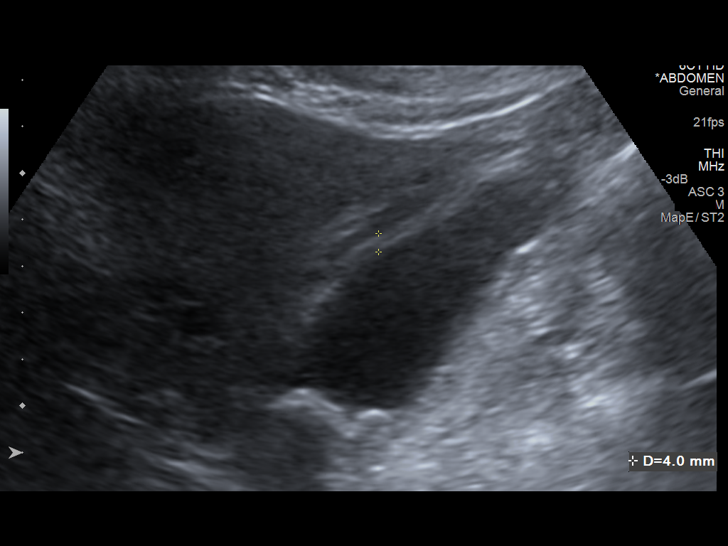
[im 9/33]
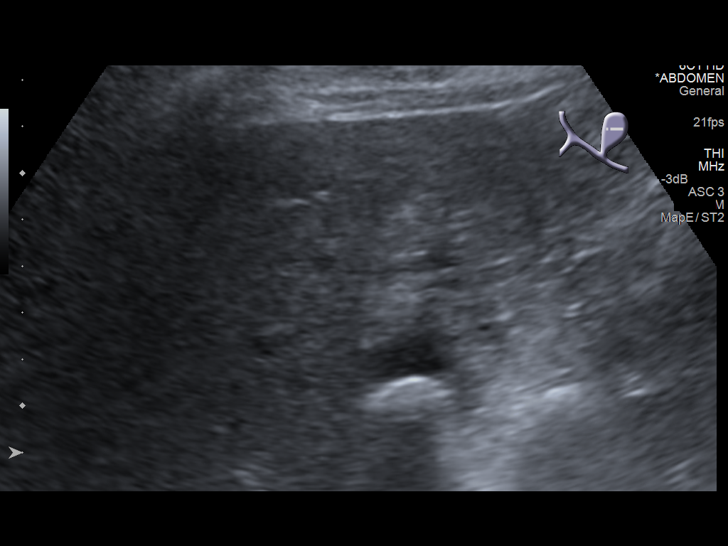
[im 11/33]
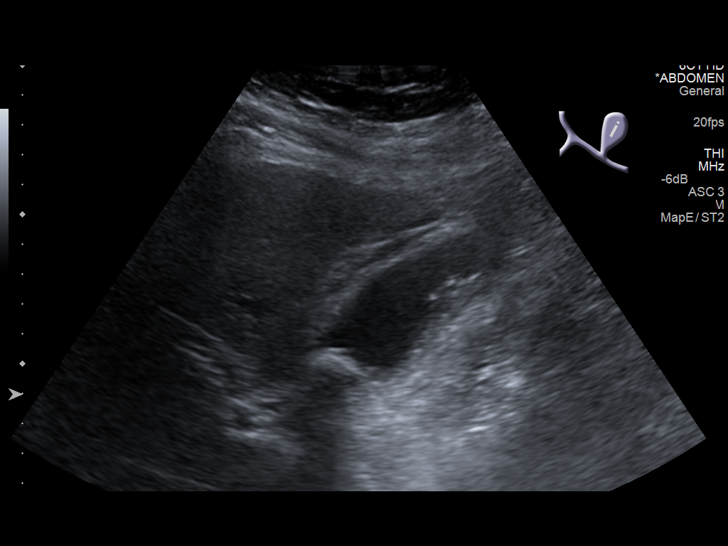
[im 13/33]
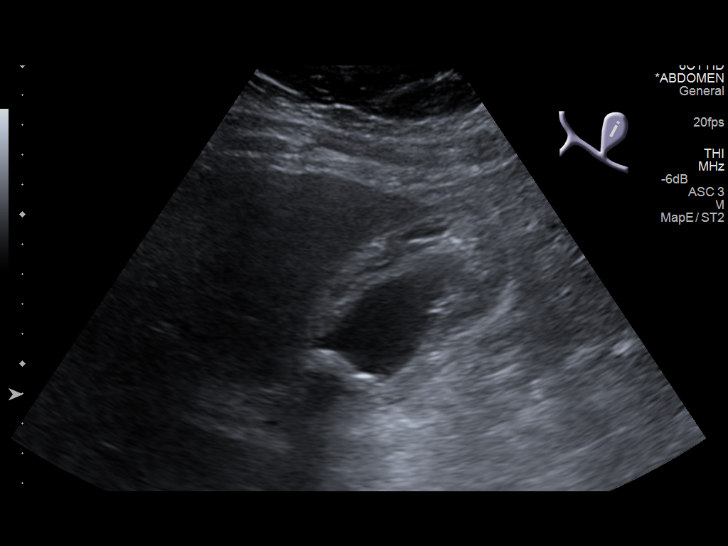
[im 15/33]
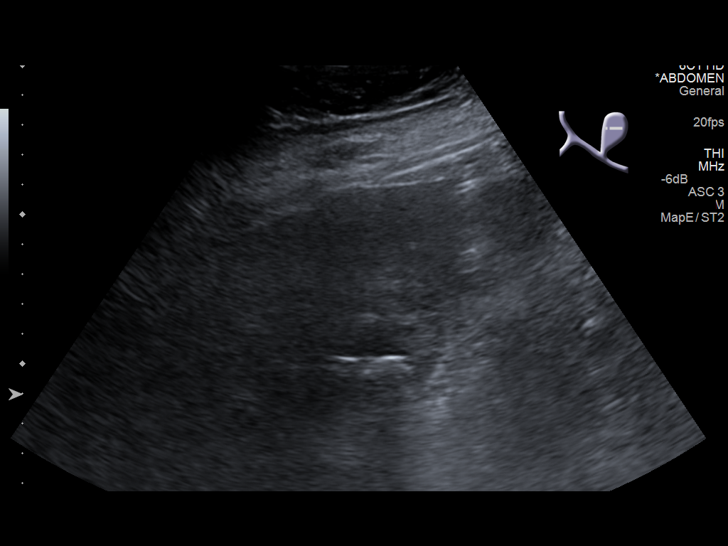
[im 18/33]
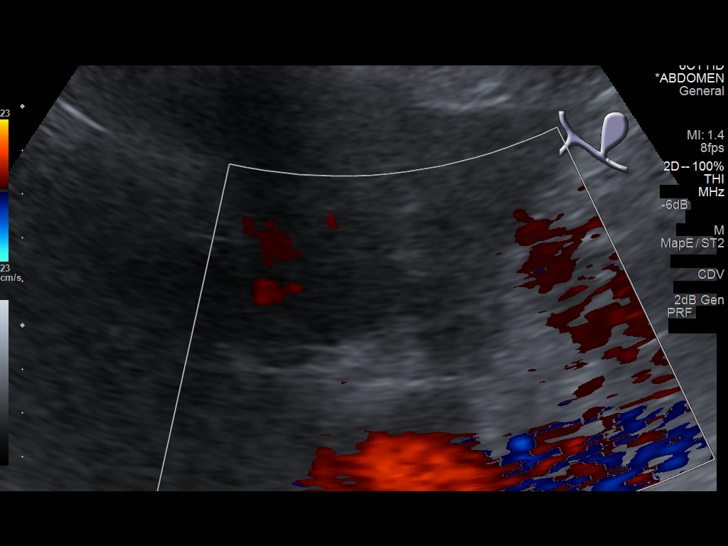
[im 21/33]
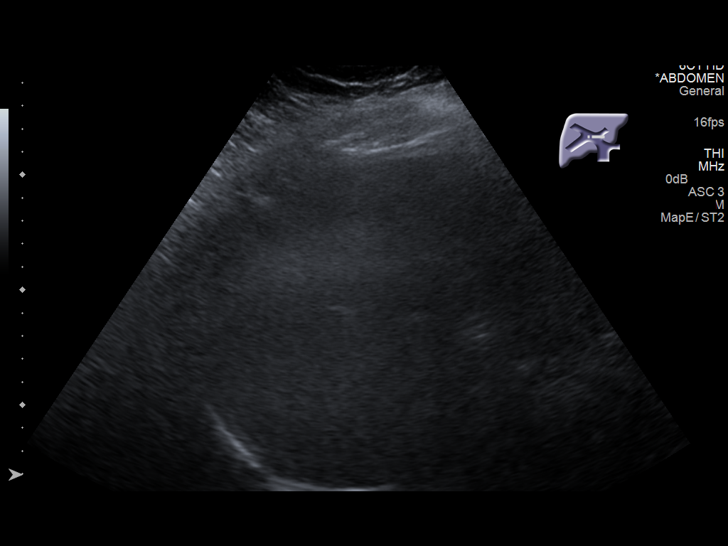
[im 22/33]
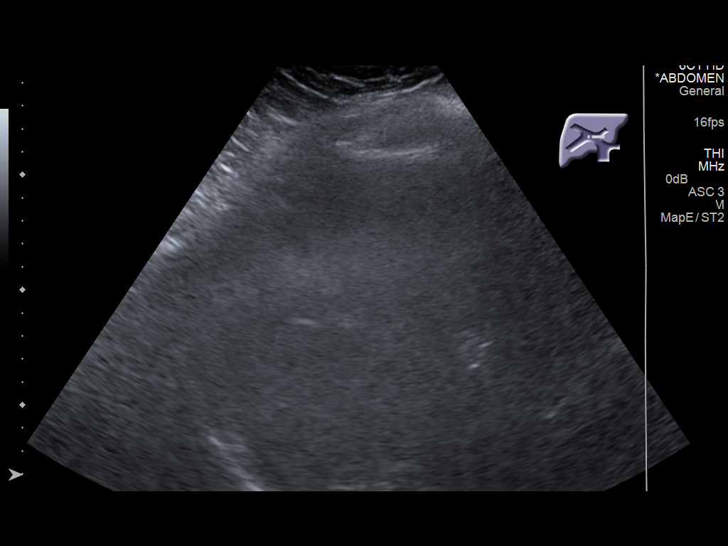
[im 25/33]
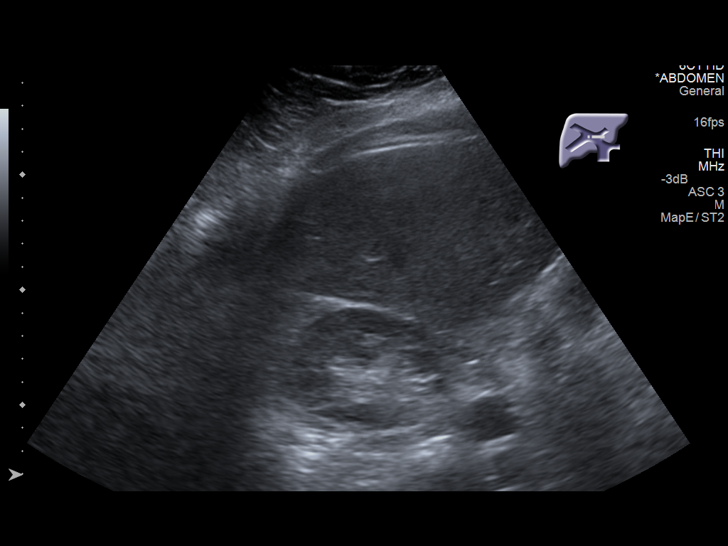
[im 27/33]
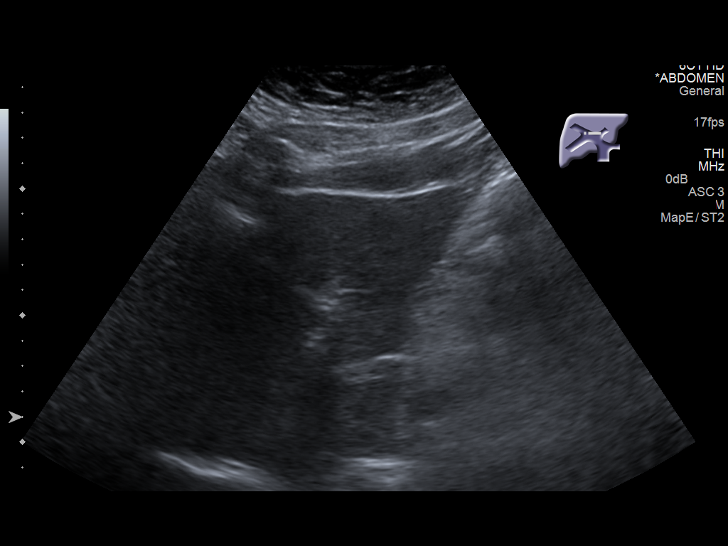
[im 30/33]
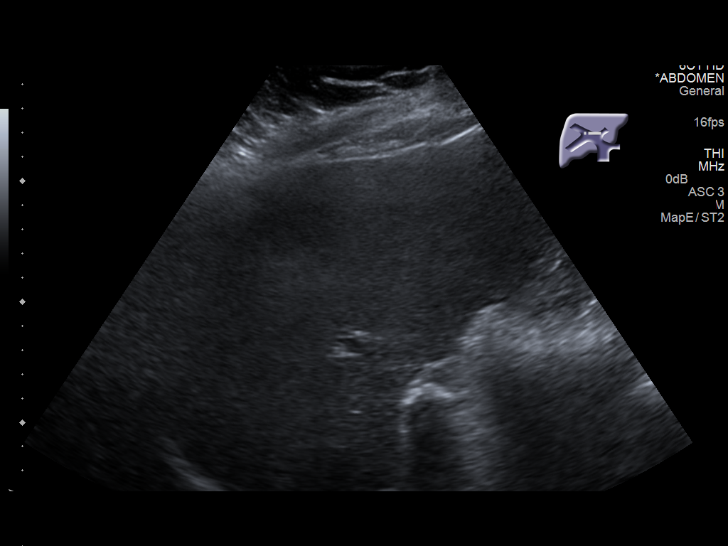
[im 33/33]
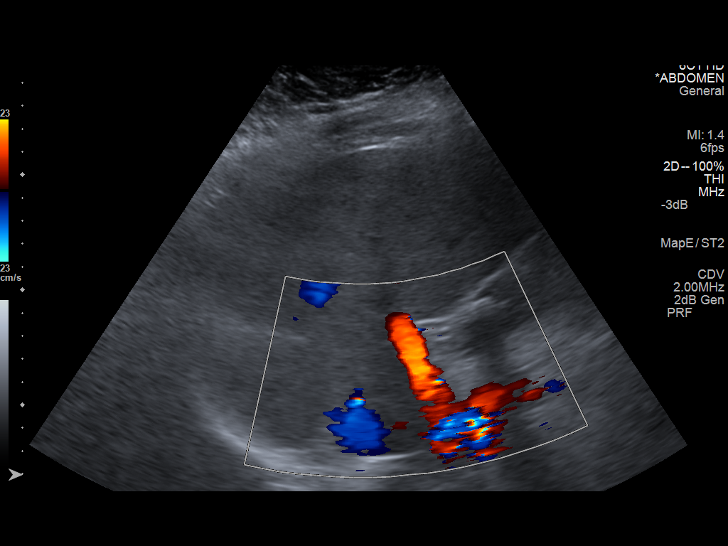

[14 of 25 positions shown; findings below may reference images not displayed]

FINDINGS: Gallbladder:

Cholelithiasis with stones demonstrated in the gallbladder neck.
Largest stone is measured at 2.2 cm diameter. Stones are nonmobile.
There is diffuse gallbladder wall thickening with pericholecystic
edema. Murphy's sign is negative.

Common bile duct:

Diameter: 3.9 mm, normal

Liver:

No focal lesion identified. Within normal limits in parenchymal
echogenicity.
IMPRESSION: Cholelithiasis with stones in the gallbladder neck. Diffuse
gallbladder wall thickening with pericholecystic edema. Changes are
nonspecific but consistent with acute cholecystitis in the
appropriate clinical setting.
